# Patient Record
Sex: Female | Born: 1950 | Race: White | Hispanic: Yes | Smoking: Never smoker
Health system: Southern US, Community
[De-identification: ages and names within clinical notes are randomized; demographics above are authoritative.]

## PROBLEM LIST (undated history)

## (undated) DIAGNOSIS — I10 Essential (primary) hypertension: Secondary | ICD-10-CM

## (undated) DIAGNOSIS — F419 Anxiety disorder, unspecified: Secondary | ICD-10-CM

---

## 2009-04-26 ENCOUNTER — Ambulatory Visit: Payer: Self-pay | Admitting: Family Medicine

## 2009-04-26 ENCOUNTER — Ambulatory Visit (HOSPITAL_COMMUNITY): Admission: RE | Admit: 2009-04-26 | Discharge: 2009-04-26 | Payer: Self-pay | Admitting: Family Medicine

## 2009-04-26 DIAGNOSIS — F329 Major depressive disorder, single episode, unspecified: Secondary | ICD-10-CM | POA: Insufficient documentation

## 2009-04-26 DIAGNOSIS — R05 Cough: Secondary | ICD-10-CM

## 2009-04-26 DIAGNOSIS — R059 Cough, unspecified: Secondary | ICD-10-CM | POA: Insufficient documentation

## 2009-04-26 DIAGNOSIS — K2970 Gastritis, unspecified, without bleeding: Secondary | ICD-10-CM | POA: Insufficient documentation

## 2009-04-26 DIAGNOSIS — I10 Essential (primary) hypertension: Secondary | ICD-10-CM | POA: Insufficient documentation

## 2009-04-26 DIAGNOSIS — K2990 Gastroduodenitis, unspecified, without bleeding: Secondary | ICD-10-CM

## 2009-04-26 DIAGNOSIS — R011 Cardiac murmur, unspecified: Secondary | ICD-10-CM

## 2009-04-27 ENCOUNTER — Ambulatory Visit: Payer: Self-pay | Admitting: Family Medicine

## 2009-04-27 ENCOUNTER — Encounter: Payer: Self-pay | Admitting: Family Medicine

## 2009-04-27 LAB — CONVERTED CEMR LAB
Albumin: 3.9 g/dL (ref 3.5–5.2)
Alkaline Phosphatase: 84 units/L (ref 39–117)
BUN: 12 mg/dL (ref 6–23)
Calcium: 9.1 mg/dL (ref 8.4–10.5)
Cholesterol: 219 mg/dL — ABNORMAL HIGH (ref 0–200)
Glucose, Bld: 88 mg/dL (ref 70–99)
HDL: 57 mg/dL (ref >39)
LDL Cholesterol: 146 mg/dL — ABNORMAL HIGH (ref 0–99)
Potassium: 4 meq/L (ref 3.5–5.3)
Triglycerides: 78 mg/dL (ref <150)

## 2009-04-30 ENCOUNTER — Encounter: Payer: Self-pay | Admitting: Family Medicine

## 2009-05-04 ENCOUNTER — Telehealth: Payer: Self-pay | Admitting: Family Medicine

## 2009-05-04 ENCOUNTER — Encounter: Payer: Self-pay | Admitting: Family Medicine

## 2009-05-31 ENCOUNTER — Ambulatory Visit: Payer: Self-pay | Admitting: Family Medicine

## 2009-05-31 DIAGNOSIS — L259 Unspecified contact dermatitis, unspecified cause: Secondary | ICD-10-CM

## 2009-05-31 DIAGNOSIS — E349 Endocrine disorder, unspecified: Secondary | ICD-10-CM | POA: Insufficient documentation

## 2009-06-21 ENCOUNTER — Ambulatory Visit: Payer: Self-pay | Admitting: Family Medicine

## 2011-01-18 ENCOUNTER — Encounter: Payer: Self-pay | Admitting: *Deleted

## 2011-02-09 ENCOUNTER — Other Ambulatory Visit: Payer: Self-pay | Admitting: Family Medicine

## 2011-02-09 NOTE — Telephone Encounter (Signed)
Please review and refill

## 2011-02-10 NOTE — Telephone Encounter (Signed)
Refill request

## 2011-02-13 ENCOUNTER — Ambulatory Visit (INDEPENDENT_AMBULATORY_CARE_PROVIDER_SITE_OTHER): Payer: Self-pay

## 2011-02-13 ENCOUNTER — Inpatient Hospital Stay (INDEPENDENT_AMBULATORY_CARE_PROVIDER_SITE_OTHER)
Admission: RE | Admit: 2011-02-13 | Discharge: 2011-02-13 | Disposition: A | Discharge status: Home / Self Care | Payer: Self-pay | Source: Ambulatory Visit | Attending: Family Medicine | Admitting: Family Medicine

## 2011-02-13 DIAGNOSIS — R319 Hematuria, unspecified: Secondary | ICD-10-CM

## 2011-02-13 DIAGNOSIS — M549 Dorsalgia, unspecified: Secondary | ICD-10-CM

## 2011-02-13 DIAGNOSIS — N390 Urinary tract infection, site not specified: Secondary | ICD-10-CM

## 2011-02-13 LAB — URINALYSIS, ROUTINE W REFLEX MICROSCOPIC
Bilirubin Urine: NEGATIVE
Nitrite: NEGATIVE
Specific Gravity, Urine: 1.019 (ref 1.005–1.030)
Urobilinogen, UA: 0.2 mg/dL (ref 0.0–1.0)

## 2011-02-13 LAB — POCT URINALYSIS DIPSTICK
Bilirubin Urine: NEGATIVE
Glucose, UA: NEGATIVE mg/dL
Nitrite: NEGATIVE
pH: 5.5 (ref 5.0–8.0)

## 2011-02-13 LAB — URINE MICROSCOPIC-ADD ON

## 2011-02-15 LAB — URINE CULTURE

## 2011-02-16 ENCOUNTER — Other Ambulatory Visit (HOSPITAL_COMMUNITY): Payer: Self-pay | Admitting: Adult Health

## 2011-02-16 DIAGNOSIS — N200 Calculus of kidney: Secondary | ICD-10-CM

## 2011-02-17 ENCOUNTER — Encounter (HOSPITAL_COMMUNITY): Payer: Self-pay

## 2011-02-17 ENCOUNTER — Ambulatory Visit (HOSPITAL_COMMUNITY)
Admission: RE | Admit: 2011-02-17 | Discharge: 2011-02-17 | Disposition: A | Discharge status: Home / Self Care | Payer: Self-pay | Source: Ambulatory Visit | Attending: Adult Health | Admitting: Adult Health

## 2011-02-17 DIAGNOSIS — N2889 Other specified disorders of kidney and ureter: Secondary | ICD-10-CM | POA: Insufficient documentation

## 2011-02-17 DIAGNOSIS — N201 Calculus of ureter: Secondary | ICD-10-CM | POA: Insufficient documentation

## 2011-02-17 DIAGNOSIS — K439 Ventral hernia without obstruction or gangrene: Secondary | ICD-10-CM | POA: Insufficient documentation

## 2011-02-17 DIAGNOSIS — N200 Calculus of kidney: Secondary | ICD-10-CM | POA: Insufficient documentation

## 2011-02-17 HISTORY — DX: Essential (primary) hypertension: I10

## 2011-02-17 MED ORDER — IOHEXOL 300 MG/ML  SOLN
100.0000 mL | Freq: Once | INTRAMUSCULAR | Status: AC | PRN
  Administered 2011-02-17: 100 mL via INTRAVENOUS

## 2011-02-27 ENCOUNTER — Ambulatory Visit (HOSPITAL_COMMUNITY)
Admission: RE | Admit: 2011-02-27 | Discharge: 2011-02-27 | Disposition: A | Discharge status: Home / Self Care | Payer: Self-pay | Source: Ambulatory Visit | Attending: Urology | Admitting: Urology

## 2011-02-27 DIAGNOSIS — I10 Essential (primary) hypertension: Secondary | ICD-10-CM | POA: Insufficient documentation

## 2011-02-27 DIAGNOSIS — K219 Gastro-esophageal reflux disease without esophagitis: Secondary | ICD-10-CM | POA: Insufficient documentation

## 2011-02-27 DIAGNOSIS — N200 Calculus of kidney: Secondary | ICD-10-CM | POA: Insufficient documentation

## 2011-06-02 ENCOUNTER — Ambulatory Visit: Payer: Self-pay | Admitting: Family Medicine

## 2012-09-08 ENCOUNTER — Other Ambulatory Visit: Payer: Self-pay | Admitting: Family Medicine

## 2012-09-10 ENCOUNTER — Other Ambulatory Visit: Payer: Self-pay | Admitting: Family Medicine

## 2013-05-12 ENCOUNTER — Other Ambulatory Visit: Payer: Self-pay | Admitting: *Deleted

## 2013-05-12 NOTE — Telephone Encounter (Signed)
Requested Prescriptions   Pending Prescriptions Disp Refills  . hydrochlorothiazide (HYDRODIURIL) 25 MG tablet 30 tablet 6   Wyatt Haste, RN-BSN

## 2013-05-19 ENCOUNTER — Emergency Department (HOSPITAL_COMMUNITY)
Admission: EM | Admit: 2013-05-19 | Discharge: 2013-05-19 | Disposition: A | Discharge status: Home / Self Care | Payer: No Typology Code available for payment source | Source: Home / Self Care | Attending: Family Medicine | Admitting: Family Medicine

## 2013-05-19 ENCOUNTER — Other Ambulatory Visit: Payer: Self-pay | Admitting: *Deleted

## 2013-05-19 ENCOUNTER — Encounter (HOSPITAL_COMMUNITY): Payer: Self-pay

## 2013-05-19 ENCOUNTER — Emergency Department (INDEPENDENT_AMBULATORY_CARE_PROVIDER_SITE_OTHER): Payer: No Typology Code available for payment source

## 2013-05-19 DIAGNOSIS — R222 Localized swelling, mass and lump, trunk: Secondary | ICD-10-CM

## 2013-05-19 DIAGNOSIS — R918 Other nonspecific abnormal finding of lung field: Secondary | ICD-10-CM

## 2013-05-19 DIAGNOSIS — I10 Essential (primary) hypertension: Secondary | ICD-10-CM

## 2013-05-19 MED ORDER — HYDROCHLOROTHIAZIDE 25 MG PO TABS: 25.0000 mg | ORAL_TABLET | Freq: Every day | ORAL | Status: DC

## 2013-05-19 NOTE — ED Notes (Signed)
History w assistance of daughter. Reports cough x couple of days. Reportedly fell on slippery floor a couple of days ago, has pain in both shoulders , worse w ROM upper arm, in scapular area, and pain in her left knee

## 2013-05-19 NOTE — ED Provider Notes (Signed)
History     CSN: [id number]  Arrival date & time 05/19/13  1618   First MD Initiated Contact with Patient 05/19/13 1745      Chief Complaint  Patient presents with  . Fall    (Consider location/radiation/quality/duration/timing/severity/associated sxs/prior treatment) Patient is a 62 y.o. female presenting with fall. The history is provided by the patient and a relative. The history is limited by a language barrier. Language interpreter used: daughter.  Fall This is a new problem. The current episode started more than 2 days ago (slipped on wet floor, no lumbar pain, no ext or head sx.). The problem has not changed since onset.Pertinent negatives include no chest pain, no abdominal pain and no shortness of breath.    Past Medical History  Diagnosis Date  . Hypertension     History reviewed. No pertinent past surgical history.  History reviewed. No pertinent family history.  History  Substance Use Topics  . Smoking status: Not on file  . Smokeless tobacco: Not on file  . Alcohol Use: Not on file    OB History   Grav Para Term Preterm Abortions TAB SAB Ect Mult Living                  Review of Systems  Constitutional: Negative.   HENT: Negative for neck pain.   Respiratory: Negative for chest tightness and shortness of breath.   Cardiovascular: Negative for chest pain and leg swelling.  Gastrointestinal: Negative.  Negative for abdominal pain.  Genitourinary: Negative.     Allergies  Ace inhibitors  Home Medications   Current Outpatient Rx  Name  Route  Sig  Dispense  Refill  . captopril (CAPOTEN) 50 MG tablet   Oral   Take 50 mg by mouth 3 (three) times daily.         . citalopram (CELEXA) 20 MG tablet   Oral   Take 20 mg by mouth daily. For 1 week then 2 by mouth once daily for 1 week then 3 once daily. Instructions in spanish please          . famotidine (PEPCID) 20 MG tablet   Oral   Take 20 mg by mouth 2 (two) times daily. Instructions in  Spanish          . hydrochlorothiazide 25 MG tablet      TAKE ONE TABLET BY MOUTH EVERY DAY -  TOME  UNA  TABLETA  VIA  ORAL  UNA  VEZ  AL  DIA   30 tablet   6   . hydrOXYzine (ATARAX) 50 MG tablet   Oral   Take 50 mg by mouth every 6 (six) hours as needed. Itching/anxiety. Instructions in spanish please          . triamcinolone (KENALOG) 0.1 % lotion   Topical   Apply topically 2 (two) times daily. To scalp. Spanish instructions            BP 157/67  Pulse 75  Temp(Src) 98.4 F (36.9 C) (Oral)  Resp 16  SpO2 100%  Physical Exam  Nursing note and vitals reviewed. Constitutional: She is oriented to person, place, and time. She appears well-developed and well-nourished.  HENT:  Head: Normocephalic and atraumatic.  Neck: Normal range of motion. Neck supple.  Cardiovascular: Normal rate, regular rhythm, normal heart sounds and intact distal pulses.   Pulmonary/Chest: Breath sounds normal. She exhibits tenderness.    Abdominal: Soft. Bowel sounds are normal.  Neurological: She is alert  and oriented to person, place, and time.  Skin: Skin is warm and dry.    ED Course  Procedures (including critical care time)  Labs Reviewed - No data to display Dg Ribs Unilateral W/chest Left  05/19/2013   *RADIOLOGY REPORT*  Clinical Data: Fall.  Complains of left lower rib fall 3 days ago.  LEFT RIBS AND CHEST - 3+ VIEW  Comparison: None.  Findings: Heart size is upper normal.  Mediastinal hilar contours within normal limits.  There is an irregular asymmetric opacity at the left lung apex with some architectural distortion. There are no prior chest radiographs for comparison.  Negative for pneumothorax or pleural effusion.  No acute or healing left rib fracture is identified.  IMPRESSION:  1.  No visible left rib fracture. 2.  Asymmetric opacity at the left lung apex is favored to be due to pleural parenchymal scarring.  There are no prior chest radiographs for comparison.  If prior  chest radiographs can be made available for comparison purposes that could be useful.  Otherwise, a noncontrast chest CT could be considered to exclude a mass.  These results will be called to the ordering clinician or representative by the Radiologist Assistant, and communication documented in the PACS Dashboard.   Original Report Authenticated By: Britta Mccreedy, M.D.     1. Lung mass   2. Hypertension       MDM  X-rays reviewed and report per radiologist.         Linna Hoff, MD 05/19/13 1911

## 2013-05-27 ENCOUNTER — Ambulatory Visit (INDEPENDENT_AMBULATORY_CARE_PROVIDER_SITE_OTHER): Payer: No Typology Code available for payment source | Admitting: Family Medicine

## 2013-05-27 ENCOUNTER — Encounter: Payer: Self-pay | Admitting: Family Medicine

## 2013-05-27 VITALS — BP 150/86 | HR 74 | Temp 98.4°F | Ht 60.0 in | Wt 196.0 lb

## 2013-05-27 DIAGNOSIS — I10 Essential (primary) hypertension: Secondary | ICD-10-CM

## 2013-05-27 DIAGNOSIS — F329 Major depressive disorder, single episode, unspecified: Secondary | ICD-10-CM

## 2013-05-27 DIAGNOSIS — K2970 Gastritis, unspecified, without bleeding: Secondary | ICD-10-CM

## 2013-05-27 DIAGNOSIS — E669 Obesity, unspecified: Secondary | ICD-10-CM

## 2013-05-27 DIAGNOSIS — R05 Cough: Secondary | ICD-10-CM

## 2013-05-27 DIAGNOSIS — K2990 Gastroduodenitis, unspecified, without bleeding: Secondary | ICD-10-CM

## 2013-05-27 LAB — CBC
MCH: 31.2 pg (ref 26.0–34.0)
MCHC: 35.8 g/dL (ref 30.0–36.0)
Platelets: 263 10*3/uL (ref 150–400)
RBC: 4.45 MIL/uL (ref 3.87–5.11)

## 2013-05-27 LAB — COMPREHENSIVE METABOLIC PANEL
ALT: 13 U/L (ref 0–35)
Alkaline Phosphatase: 96 U/L (ref 39–117)
CO2: 30 mEq/L (ref 19–32)
Sodium: 140 mEq/L (ref 135–145)
Total Bilirubin: 0.6 mg/dL (ref 0.3–1.2)
Total Protein: 7.6 g/dL (ref 6.0–8.3)

## 2013-05-27 LAB — LIPID PANEL
Total CHOL/HDL Ratio: 4.2 Ratio
VLDL: 16 mg/dL (ref 0–40)

## 2013-05-27 MED ORDER — AMLODIPINE BESYLATE 5 MG PO TABS: 5.0000 mg | ORAL_TABLET | Freq: Every day | ORAL | Status: DC

## 2013-05-27 MED ORDER — HYDROCHLOROTHIAZIDE 25 MG PO TABS: 25.0000 mg | ORAL_TABLET | Freq: Every day | ORAL | Status: DC

## 2013-05-27 MED ORDER — RANITIDINE HCL 150 MG PO TABS: 150.0000 mg | ORAL_TABLET | Freq: Two times a day (BID) | ORAL | Status: DC

## 2013-05-27 NOTE — Patient Instructions (Addendum)
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   Para la presion alta:  1. Descontinue la captopril.  2. Comience a tomar Amlodipine 5mg  una tableta por dia, en las mananas.  3. Le llamo con los [employer] estudios de hoy Kimber[place], Aeronautical engineer).  Para el dolor en el costado: 1. Aplicar bolsa de agua caliente varias veces por dia.  2. Puede tomar ibuprofen tabletas de 200mg , tome 2 a 4 tabletas cada 6 a 8 horas segun necesite, por hasta 7 dias consecutivos.   Para la depresion: Estamos chequeando la funcion de la tiroide.  Quiero verle en un mes para saber como se siente.   Para el dolor en la boca del [place]: Tome la ranitidine 150mg , una tableta dos veces por dia, hasta la proxima visita.   FOLLOW UP VISIT IN 4 TO 6 WEEKS WITH DR Mauricio Po

## 2013-05-27 NOTE — Assessment & Plan Note (Addendum)
i think from ACEI.  Will stop captopril and change to other med (amlodipine).

## 2013-05-28 ENCOUNTER — Telehealth: Payer: Self-pay | Admitting: Family Medicine

## 2013-05-28 NOTE — Telephone Encounter (Signed)
Called mobile number on chart [phone]) and left messagein Spanish on daughter-in-law's phone [name]) that lab results do not require additional intervention, asked that she call with questions.  JB

## 2013-05-28 NOTE — Assessment & Plan Note (Signed)
To change from ACEI (captopril) to CCB (amlodipine) due to cough that I suspect is related to the ACEI use.  Will reassess BP and cough at follow up.  Check chem panel and lipid profile today.

## 2013-05-28 NOTE — Assessment & Plan Note (Signed)
Admits to depressive symptoms (anhedonia, feeling depressed) that is worse when in [place], where her son there is more financially distressed and this causes her anguish.  She does not have the citalopram that is listed on her med list; will reassess after checking TSH and discuss at follow up.

## 2013-05-28 NOTE — Assessment & Plan Note (Addendum)
Trial H2blocker and dietary changes.  To reassess at follow up.  This may also be contributing to her complaint of longstanding cough.

## 2013-05-28 NOTE — Progress Notes (Signed)
  Subjective:    Patient ID: Brenda Wyatt, female    DOB: [birth year], 62 y.o.   MRN: [id number]  HPI Visit conducted in Spanish. New patient visit (last seen in this office in 2010).   [name] is accompanied by her daughter-in-law, [name].    Here for follow up of HTN, also to discuss three other problems:   1. C/o frequent dry cough and "pulmonary infections".  She lives half-time in her native [place] and the other half in [place]; notices it is worse in [place] than here.  Has been treated for pneumonia several times while there, has had workup for the cough including CXR.  Feels subjectively warm but has not measured a fever.  The current flare of this complaint (cough without sputum) has been ongoing for 15 days.  No blood in the scant sputum she can bring up.  Never-smoker. No known history of asthma or other lung disease (other than the pneumonia). No known contact with persons with TB, either here or in [place].    2. Some discomfort, like a fullness, in the epigastric area.  Has vague sensation of nausea but does not vomit.  Does not eat much (corroborated by [name]).  No objective weight loss. No diarrhea, no blood in stool.   3. Patient had a slip-and-fall at home eight days ago, landed on left flank.  Still with some soreness in the area, especially with the cough.  No dyspnea, no blood-tinged sputum.  No radiating pain to abdomen or anterior chest.  Has not noticed much in the way of bruising.  The area feels "like a cramp" when she coughs or breathes deeply.   Social Hx; Never-smoker; does not drink alcohol.  Two sons live in [place] and two sons here.  She does have some feelings of depression but more so when in [place] (one son there had fallen from a roof while working and now is unable to work, has financial difficulties).  She traveled here from [place] by bus.    Family Hx; Mother died o Review of Systems See above. Feels "tickle" in back of throat that  triggers cough. No fevers or chills. No thoughts of self-harm.     Objective:   Physical Exam Well appearing, no apparent distress HEENT Neck supple. Thyroid supple, no nodularity or tenderness, no goiter. Moist mucus membranes. No cervical adenopathy.  COR Regular S1S2, no extra sounds.  PULM Clear bilaterally, no rales or wheezes.  CHEST WALL: question of mild ecchymosis over left lateral chest wall (@ ribs 9-10).  No step-off or notable point tenderness.  ABD Soft, nontender, nondistended. No organomegaly or masses.  Perhaps mild discomfort at epigastrium with palpation.  LEs no edema noted in lower extremities.        Assessment & Plan:

## 2013-09-05 ENCOUNTER — Other Ambulatory Visit: Payer: Self-pay | Admitting: Family Medicine

## 2013-09-05 ENCOUNTER — Ambulatory Visit (INDEPENDENT_AMBULATORY_CARE_PROVIDER_SITE_OTHER): Payer: No Typology Code available for payment source | Admitting: Family Medicine

## 2013-09-05 ENCOUNTER — Encounter: Payer: Self-pay | Admitting: Family Medicine

## 2013-09-05 VITALS — BP 150/80 | HR 66 | Ht 60.0 in | Wt 199.5 lb

## 2013-09-05 DIAGNOSIS — F329 Major depressive disorder, single episode, unspecified: Secondary | ICD-10-CM

## 2013-09-05 DIAGNOSIS — I10 Essential (primary) hypertension: Secondary | ICD-10-CM

## 2013-09-05 DIAGNOSIS — K2970 Gastritis, unspecified, without bleeding: Secondary | ICD-10-CM

## 2013-09-05 LAB — COMPREHENSIVE METABOLIC PANEL
AST: 21 U/L (ref 0–37)
Albumin: 3.9 g/dL (ref 3.5–5.2)
Alkaline Phosphatase: 88 U/L (ref 39–117)
Potassium: 3.6 mEq/L (ref 3.5–5.3)
Sodium: 138 mEq/L (ref 135–145)
Total Protein: 8 g/dL (ref 6.0–8.3)

## 2013-09-05 MED ORDER — OMEPRAZOLE 20 MG PO CPDR: 40.0000 mg | DELAYED_RELEASE_CAPSULE | Freq: Every day | ORAL | Status: DC

## 2013-09-05 MED ORDER — HYDROCHLOROTHIAZIDE 25 MG PO TABS: 25.0000 mg | ORAL_TABLET | Freq: Every day | ORAL | Status: DC

## 2013-09-05 MED ORDER — AMLODIPINE BESYLATE 5 MG PO TABS: 5.0000 mg | ORAL_TABLET | Freq: Every day | ORAL | Status: DC

## 2013-09-05 NOTE — Patient Instructions (Addendum)
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  Recibio' la vacuna de gripe hoy.  Para el estrenimiento y la sensacion en la boca del [place], quiero chequearle una tomografia del abdomen.  Le llamo al cel. [phone] [name], nuera) con [place].   Cambie' la medicina del [place] al OMEPRAZOLE 20MG , TOME 2 POR BOCA UNA VEZ POR DIA.   Las demas medicinas para la presion las [place]' a la farmacia en cantidades de 90 dias.   Espero que tenga feliz viaje el 30 de octubre!

## 2013-09-06 NOTE — Progress Notes (Signed)
  Subjective:    Patient ID: Brenda Wyatt, female    DOB: 19-Feb-1951, 62 y.o.   MRN: [id number]  HPI Visit conducted in Spanish. Patient here with her daughter-in-law, Brenda Wyatt. Here for follow up of HTN, as well as to address epigastric discomfort, which she describes as "tension" just below the xyphoid.  This sensation, which is not stabbing or sharp, comes and goes and is associated with nervousness.  Does not have everyday.  She did not notice an improvement with the ranitidine, which she has stopped taking.  Patient also has a history of depression and had been taking citalopram, but stopped taking it awhile ago.  She did not find it to be helpful.    Lastly, patient reports changes in her bowel habits and patterns.  Says she has been more constipated than usual.  She usually has a bowel movement every 3 days, has been longer intervals recently.  Stool has appeared to be narrower in caliber, without blood or mucus.  She denies fever, chills, sweats, chest pain, or cough. She has not had unintentional weight loss.  She and Brenda Wyatt recall a bout of lung disease many years ago during which time she says she was told she might not survive.   Family Hx; mother died in her 41s of liver cancer.   Social Hx; Patient has never been a smoker, does not drink alcohol.  She plans to travel to [place] on Oct 30th to pass the winter there. Review of Systems     Objective:   Physical Exam Generally well appearing, nervous affect, no acute distress HEENT Neck supple, no cervical adenopathy. MMM COR Regular S1S2 PULM Clear bilaterally.  ABD Soft, nondistended and with audible bowel sounds. Mild epigastric tenderness, no organomegaly.  No masses appreciated.  PHQ9 done today in Spanish scores a 7 (3 pts Q#3; 2 pts Q#4; 1 pt Q#1,2).       Assessment & Plan:

## 2013-09-06 NOTE — Assessment & Plan Note (Addendum)
Unclear if her abdominal complaints are more related to anxiety or true gastritis.  No associated with mealtimes; described in emotional terms rather than somatic terms ("nervous", "tension").  Will change to generic PPI and see if this is helpful.  Also, concern for narrowing in the caliber of her stools and worsening constipation.  Patient herself voices concern for liver cancer given her mother's death from this.  WIll order labs, CT abd/pelvis.  Discussed recommendation for GI referral for possible endoscopic evaluation, patient does not have payer source for this.  Will get preliminary studies first and then proceed from there.  Could also consider barium enema initially, as patient has [employer].

## 2013-09-06 NOTE — Assessment & Plan Note (Signed)
Patient with diagnosis of depression, PHQ9 today is 7, without SI or HI.  She is not on antidepressants.  Is animated when talking about upcoming travel to [place], which I believe is therapeutic for her.  No further interventions at this time.

## 2013-09-08 ENCOUNTER — Telehealth: Payer: Self-pay | Admitting: Family Medicine

## 2013-09-08 ENCOUNTER — Ambulatory Visit (HOSPITAL_COMMUNITY): Admission: RE | Admit: 2013-09-08 | Payer: No Typology Code available for payment source | Source: Ambulatory Visit

## 2013-09-08 DIAGNOSIS — I10 Essential (primary) hypertension: Secondary | ICD-10-CM

## 2013-09-08 DIAGNOSIS — K2970 Gastritis, unspecified, without bleeding: Secondary | ICD-10-CM

## 2013-09-08 MED ORDER — HYDROCHLOROTHIAZIDE 25 MG PO TABS: 25.0000 mg | ORAL_TABLET | Freq: Every day | ORAL | Status: DC

## 2013-09-08 MED ORDER — OMEPRAZOLE 20 MG PO CPDR: 40.0000 mg | DELAYED_RELEASE_CAPSULE | Freq: Every day | ORAL | Status: DC

## 2013-09-08 MED ORDER — AMLODIPINE BESYLATE 5 MG PO TABS: 5.0000 mg | ORAL_TABLET | Freq: Every day | ORAL | Status: DC

## 2013-09-08 NOTE — Telephone Encounter (Signed)
Called daughter-in-law [name] and discussed lab results.  She says that the med prescriptions were not available at [facility].  I am going to resend these prescriptions for 90 days each.  She has appointment for CT abd/pelvis on Monday, October 13th.  JB

## 2014-07-17 ENCOUNTER — Encounter: Payer: Self-pay | Admitting: Family Medicine

## 2014-07-17 ENCOUNTER — Ambulatory Visit (INDEPENDENT_AMBULATORY_CARE_PROVIDER_SITE_OTHER): Payer: Self-pay | Admitting: Family Medicine

## 2014-07-17 ENCOUNTER — Ambulatory Visit (HOSPITAL_COMMUNITY)
Admission: RE | Admit: 2014-07-17 | Discharge: 2014-07-17 | Disposition: A | Discharge status: Home / Self Care | Payer: Self-pay | Source: Ambulatory Visit | Attending: Family Medicine | Admitting: Family Medicine

## 2014-07-17 VITALS — BP 156/74 | HR 78 | Temp 98.2°F | Ht 60.0 in | Wt 203.7 lb

## 2014-07-17 DIAGNOSIS — I10 Essential (primary) hypertension: Secondary | ICD-10-CM | POA: Insufficient documentation

## 2014-07-17 DIAGNOSIS — R3589 Other polyuria: Secondary | ICD-10-CM | POA: Insufficient documentation

## 2014-07-17 DIAGNOSIS — R609 Edema, unspecified: Secondary | ICD-10-CM

## 2014-07-17 DIAGNOSIS — L83 Acanthosis nigricans: Secondary | ICD-10-CM

## 2014-07-17 DIAGNOSIS — R600 Localized edema: Secondary | ICD-10-CM

## 2014-07-17 DIAGNOSIS — R358 Other polyuria: Secondary | ICD-10-CM | POA: Insufficient documentation

## 2014-07-17 LAB — POCT URINALYSIS DIPSTICK
BILIRUBIN UA: NEGATIVE
GLUCOSE UA: NEGATIVE
Ketones, UA: NEGATIVE
NITRITE UA: NEGATIVE
Protein, UA: NEGATIVE
Spec Grav, UA: 1.02
UROBILINOGEN UA: 0.2
pH, UA: 7

## 2014-07-17 LAB — COMPREHENSIVE METABOLIC PANEL
ALK PHOS: 80 U/L (ref 39–117)
ALT: 15 U/L (ref 0–35)
AST: 18 U/L (ref 0–37)
Albumin: 4.1 g/dL (ref 3.5–5.2)
BILIRUBIN TOTAL: 0.6 mg/dL (ref 0.2–1.2)
BUN: 13 mg/dL (ref 6–23)
CO2: 27 mEq/L (ref 19–32)
Calcium: 8.9 mg/dL (ref 8.4–10.5)
Chloride: 102 mEq/L (ref 96–112)
Creat: 0.57 mg/dL (ref 0.50–1.10)
GLUCOSE: 102 mg/dL — AB (ref 70–99)
POTASSIUM: 4 meq/L (ref 3.5–5.3)
SODIUM: 140 meq/L (ref 135–145)
TOTAL PROTEIN: 7.6 g/dL (ref 6.0–8.3)

## 2014-07-17 LAB — POCT UA - MICROSCOPIC ONLY

## 2014-07-17 LAB — CBC
HCT: 40 % (ref 36.0–46.0)
HEMOGLOBIN: 13.8 g/dL (ref 12.0–15.0)
MCH: 30.1 pg (ref 26.0–34.0)
MCHC: 34.5 g/dL (ref 30.0–36.0)
MCV: 87.1 fL (ref 78.0–100.0)
Platelets: 260 10*3/uL (ref 150–400)
RBC: 4.59 MIL/uL (ref 3.87–5.11)
RDW: 13.8 % (ref 11.5–15.5)
WBC: 6.2 10*3/uL (ref 4.0–10.5)

## 2014-07-17 LAB — POCT GLYCOSYLATED HEMOGLOBIN (HGB A1C): Hemoglobin A1C: 5.8

## 2014-07-17 MED ORDER — HYDROCHLOROTHIAZIDE 25 MG PO TABS: 25.0000 mg | ORAL_TABLET | Freq: Every day | ORAL | Status: DC

## 2014-07-17 MED ORDER — METOPROLOL TARTRATE 25 MG PO TABS: 25.0000 mg | ORAL_TABLET | Freq: Two times a day (BID) | ORAL | Status: DC

## 2014-07-17 NOTE — Assessment & Plan Note (Signed)
Polyuria and acanthosis, raises concerns for DM.  Also to evaluate for possible UTI as cause of polyuria. UA in office today.

## 2014-07-17 NOTE — Progress Notes (Signed)
   Subjective:    Patient ID: Brenda Wyatt, female    DOB: March 14, 1951, 63 y.o.   MRN: [id number]  HPI Visit conducted in [place].  Patient presents for a few complaints:   1. C/o bilateral ankle swelling.  Has noticed over the past several months. Wonders if it's related to having her hair dyed. Denies chest pain but does have intermittent episodes of palpitations and mild pressure which may last for minutes to hours. Unable to give precise time course for this complaint. Some shortness of breath associated with this complaint and with exertion (ie, climbing stairs).   2. Generalized hot flashes for several years. Menopause in her 29s.  No vaginal bleeding or discharge. She reports having had mammography (screening) in January of 2015 and PAP smear in May 2014, both in [place] and both reported as normal.  No fevers, no weight loss.  Has had decreased appetite but does gain weight.   3. Reports polyuria without dysuria.  No change in urine appearance. No diarrhea or loose stools.    Review of Systems See HPI    Objective:   Physical Exam Well appearing, no apparent distress HEENT Neck supple, No JVD. Normal supple thyroid without nodularity or tenderness. No cervical adenopathy. COR Regular S1S2, no extra sounds PULM Clear bilaterally, no rales or wheezes.  SKIN: Acanthosis nigricans noted along neck line.  ABD Soft, nontender, nondistended. No masses noted.  EXTS: 1+ bilateral lower extremity edema to 1/3 way up calves. No disparity in calf girth. No calf tenderness. Marked superficial tortuous varicosities on both legs. NEURO Able to climb up onto exam table without difficulty. Gait unremarkable without assistance.        Assessment & Plan:

## 2014-07-17 NOTE — Assessment & Plan Note (Signed)
Bilateral lower extremity edema; CXR, ECG, labs to evaluate for renal function, protein. UA for polyuria.

## 2014-07-17 NOTE — Patient Instructions (Signed)
Fue un Civil Service fast streamer.   Para la presion alta, quiero que DEJE DE TOMAR LA AMLODIPINE.  Siga tomando la HYDROCHLOROTHIAZIDE 25mg  UNA TABLETA DIARIO. Estamos comenzando una nueva medicina, METOPROLOL 25mg  UNA TABLETA 2 VECES POR DIA.   Electrocardiograma hoy.  Le estoy mandando para una placa del pecho para la fatiga de la respiracion.   Laboratorios h[place].   Para el hinchazon de las piernas, quiero que se compre un par de medias apretadas que se venden en la farmacia ("Compression hose") sin receta, y que las use todos los [place].   Quiero volver a Public affairs consultant.   FOLLOW UP WITH DR Lindell Noe IN 1 MONTH.  PATIENT WITH ORANGE CARD, RECOMMENDED FOR SCREENING COLONOSCOPY, DISCUSSED WITH PATIENT.

## 2014-07-17 NOTE — Assessment & Plan Note (Signed)
HTN not well controlled, suspect the amlodipine may be contributing to LE edema. Degree of anxiety as well; plan to d/c CCB and start metoprolol bid. She had been on ACEI but had developed a cough with this, which disappeared when ACEI was discontinued. Continue HCTZ along with the newly started BB. May continue baby ASA daily for prevention.

## 2014-07-18 LAB — TSH: TSH: 0.405 u[IU]/mL (ref 0.350–4.500)

## 2014-07-20 ENCOUNTER — Encounter: Payer: Self-pay | Admitting: Family Medicine

## 2019-05-22 ENCOUNTER — Ambulatory Visit (HOSPITAL_COMMUNITY)
Admission: EM | Admit: 2019-05-22 | Discharge: 2019-05-22 | Disposition: A | Discharge status: Home / Self Care | Payer: Medicare Other | Attending: Family Medicine | Admitting: Family Medicine

## 2019-05-22 ENCOUNTER — Telehealth: Payer: Self-pay | Admitting: *Deleted

## 2019-05-22 ENCOUNTER — Encounter (HOSPITAL_COMMUNITY): Payer: Self-pay | Admitting: Emergency Medicine

## 2019-05-22 ENCOUNTER — Other Ambulatory Visit: Payer: Self-pay

## 2019-05-22 DIAGNOSIS — Z20822 Contact with and (suspected) exposure to covid-19: Secondary | ICD-10-CM

## 2019-05-22 DIAGNOSIS — R05 Cough: Secondary | ICD-10-CM

## 2019-05-22 DIAGNOSIS — M791 Myalgia, unspecified site: Secondary | ICD-10-CM | POA: Diagnosis not present

## 2019-05-22 DIAGNOSIS — R6889 Other general symptoms and signs: Secondary | ICD-10-CM

## 2019-05-22 DIAGNOSIS — Z20828 Contact with and (suspected) exposure to other viral communicable diseases: Secondary | ICD-10-CM

## 2019-05-22 DIAGNOSIS — R509 Fever, unspecified: Secondary | ICD-10-CM

## 2019-05-22 DIAGNOSIS — I10 Essential (primary) hypertension: Secondary | ICD-10-CM | POA: Diagnosis not present

## 2019-05-22 MED ORDER — BENZONATATE 200 MG PO CAPS: 200.0000 mg | ORAL_CAPSULE | Freq: Three times a day (TID) | ORAL | 0 refills | Status: AC | PRN

## 2019-05-22 NOTE — Addendum Note (Signed)
Addended by: Linus Orn A on: 05/22/2019 01:04 PM   Modules accepted: Orders

## 2019-05-22 NOTE — Telephone Encounter (Signed)
Attempted to contact pt's daughter x 2 and received voicemail. Left message for pt's daughter, Verdis Frederickson to return call to be scheduled for COVID testing.

## 2019-05-22 NOTE — Telephone Encounter (Signed)
-----   Message from Janith Lima, PA-C sent at 05/22/2019 10:42 AM EDT ----- Regarding: Needs covid testing Fever, cough x 5 days, known exposure Contact daughterJohnette Abraham [phone]

## 2019-05-22 NOTE — Discharge Instructions (Signed)
Tessalon si necesita para tos Tylenol y ibuprofen para fiebre, dolor del cuerpo Regrese si no mejorna, tiene falta de aire, dolor del [place], [employer]

## 2019-05-22 NOTE — Telephone Encounter (Signed)
Pt. Called back. Scheduled for today.

## 2019-05-22 NOTE — ED Triage Notes (Signed)
Pt c/o body aches, fever, x3 days, pt has family member who tested positive for covid

## 2019-05-23 NOTE — ED Provider Notes (Signed)
[place]    CSN: [id number] Arrival date & time: 05/22/19  1779      History   Chief Complaint Chief Complaint  Patient presents with  . Exposure to COVID  . Generalized Body Aches    HPI Brenda Wyatt is a 68 y.o. female history of hypertension presenting today for evaluation of fever body aches and cough.  Patient symptoms began 5 days ago.  She had fever earlier in the week, but she feels her symptoms have improved.  She is starting to feel better.  She denies any chest pain or shortness of breath.  Her husband has tested positive for COVID and her daughter is here with similar symptoms and fever.  She is not taking any medicines for her symptoms.  HPI  Past Medical History:  Diagnosis Date  . Hypertension     Patient Active Problem List   Diagnosis Date Noted  . Lower extremity edema 07/17/2014  . Polyuria 07/17/2014  . Acanthosis nigricans 07/17/2014  . ENDOGENOUS OBESITY 05/31/2009  . CONTACT DERMATITIS&OTHER ECZEMA DUE UNSPEC CAUSE 05/31/2009  . DEPRESSION 04/26/2009  . HYPERTENSION 04/26/2009  . GASTRITIS 04/26/2009  . CARDIAC MURMUR 04/26/2009  . COUGH 04/26/2009    History reviewed. No pertinent surgical history.  OB History   No obstetric history on file.      Home Medications    Prior to Admission medications   Medication Sig Start Date End Date Taking? Authorizing Provider  benzonatate (TESSALON) 200 MG capsule Take 1 capsule (200 mg total) by mouth 3 (three) times daily as needed for up to 7 days for cough. 05/22/19 05/29/19  Terin Cragle C, PA-C  hydrochlorothiazide (HYDRODIURIL) 25 MG tablet Take 1 tablet (25 mg total) by mouth daily. 07/17/14   Willeen Niece, MD  metoprolol tartrate (LOPRESSOR) 25 MG tablet Take 1 tablet (25 mg total) by mouth 2 (two) times daily. 07/17/14   Willeen Niece, MD  omeprazole (PRILOSEC) 20 MG capsule Take 2 capsules (40 mg total) by mouth daily. 09/08/13   Willeen Niece, MD    Family  History No family history on file.  Social History Social History   Tobacco Use  . Smoking status: Never Smoker  Substance Use Topics  . Alcohol use: Not on file  . Drug use: Not on file     Allergies   Ace inhibitors and Penicillins   Review of Systems Review of Systems  Constitutional: Positive for fatigue and fever. Negative for activity change, appetite change and chills.  HENT: Positive for congestion. Negative for ear pain, rhinorrhea, sinus pressure, sore throat and trouble swallowing.   Eyes: Negative for discharge and redness.  Respiratory: Positive for cough. Negative for chest tightness and shortness of breath.   Cardiovascular: Negative for chest pain.  Gastrointestinal: Negative for abdominal pain, diarrhea, nausea and vomiting.  Musculoskeletal: Negative for myalgias.  Skin: Negative for rash.  Neurological: Negative for dizziness, light-headedness and headaches.     Physical Exam Triage Vital Signs ED Triage Vitals [05/22/19 1011]  Enc Vitals Group     BP (!) 174/62     Pulse Rate 76     Resp 16     Temp 97.8 F (36.6 C)     Temp src      SpO2 100 %     Weight      Height      Head Circumference      Peak Flow      Pain Score 5  Pain Loc      Pain Edu?      Excl. in [place]?    No data found.  Updated Vital Signs BP (!) 174/62   Pulse 76   Temp 97.8 F (36.6 C)   Resp 16   SpO2 100%   Visual Acuity Right Eye Distance:   Left Eye Distance:   Bilateral Distance:    Right Eye Near:   Left Eye Near:    Bilateral Near:     Physical Exam Vitals signs and nursing note reviewed.  Constitutional:      General: She is not in acute distress.    Appearance: She is well-developed.     Comments: No acute distress  HENT:     Head: Normocephalic and atraumatic.     Mouth/Throat:     Comments: Oral mucosa pink and moist, no tonsillar enlargement or exudate. Posterior pharynx patent and nonerythematous, no uvula deviation or swelling. Normal  phonation. Eyes:     Conjunctiva/sclera: Conjunctivae normal.  Neck:     Musculoskeletal: Neck supple.  Cardiovascular:     Rate and Rhythm: Normal rate and regular rhythm.     Heart sounds: No murmur.  Pulmonary:     Effort: Pulmonary effort is normal. No respiratory distress.     Breath sounds: Normal breath sounds.     Comments: Breathing comfortably at rest, CTABL, no wheezing, rales or other adventitious sounds auscultated\  Abdominal:     Palpations: Abdomen is soft.     Tenderness: There is no abdominal tenderness.  Skin:    General: Skin is warm and dry.  Neurological:     Mental Status: She is alert.      UC Treatments / Results  Labs (all labs ordered are listed, but only abnormal results are displayed) Labs Reviewed - No data to display  EKG None  Radiology No results found.  Procedures Procedures (including critical care time)  Medications Ordered in UC Medications - No data to display  Initial Impression / Assessment and Plan / UC Course  I have reviewed the triage vital signs and the nursing notes.  Pertinent labs & imaging results that were available during my care of the patient were reviewed by me and considered in my medical decision making (see chart for details).     Fever earlier in the week, vital signs stable now, mild URI symptoms, known exposure to COVID.  Will set up For testing, information sent to PCP.  Symptoms most likely viral/COVID.  Recommending symptomatic and supportive care, monitor for continued improvement.  Push fluids and rest.  Tylenol as needed.  Tessalon as needed for cough.Discussed strict return precautions. Patient verbalized understanding and is agreeable with plan.  Final Clinical Impressions(s) / UC Diagnoses   Final diagnoses:  Suspected Covid-19 Virus Infection     Discharge Instructions     Tessalon si necesita para tos Tylenol y ibuprofen para fiebre, dolor del cuerpo Regrese si no mejorna, tiene falta  de aire, dolor del [place], [facility]   ED Prescriptions    Medication Sig Dispense Auth. Provider   benzonatate (TESSALON) 200 MG capsule Take 1 capsule (200 mg total) by mouth 3 (three) times daily as needed for up to 7 days for cough. 28 capsule Bannon Giammarco C, PA-C     Controlled Substance Prescriptions [place] Controlled Substance Registry consulted? Not Applicable   Janith Lima, [place] 05/23/19 [id number]

## 2019-05-24 LAB — NOVEL CORONAVIRUS, NAA: SARS-CoV-2, NAA: DETECTED — AB

## 2019-05-25 ENCOUNTER — Telehealth: Payer: Self-pay

## 2019-05-25 NOTE — Telephone Encounter (Signed)
Called [facility] and left VM and informed them of pt's name, DOB, address and phone number and +Covid 19 result.

## 2019-05-26 ENCOUNTER — Telehealth: Payer: Self-pay

## 2019-05-26 NOTE — Telephone Encounter (Signed)
Faxed COVID test results to [facility]; Attn.: Tammy @ [phone].

## 2020-04-01 ENCOUNTER — Emergency Department (HOSPITAL_COMMUNITY): Payer: Medicare Other

## 2020-04-01 ENCOUNTER — Encounter (HOSPITAL_COMMUNITY): Payer: Self-pay | Admitting: Emergency Medicine

## 2020-04-01 ENCOUNTER — Emergency Department (HOSPITAL_COMMUNITY)
Admission: EM | Admit: 2020-04-01 | Discharge: 2020-04-02 | Disposition: A | Discharge status: Home / Self Care | Payer: Medicare Other | Attending: Emergency Medicine | Admitting: Emergency Medicine

## 2020-04-01 ENCOUNTER — Other Ambulatory Visit: Payer: Self-pay

## 2020-04-01 DIAGNOSIS — R002 Palpitations: Secondary | ICD-10-CM | POA: Diagnosis not present

## 2020-04-01 DIAGNOSIS — R0602 Shortness of breath: Secondary | ICD-10-CM | POA: Insufficient documentation

## 2020-04-01 DIAGNOSIS — Z5321 Procedure and treatment not carried out due to patient leaving prior to being seen by health care provider: Secondary | ICD-10-CM | POA: Diagnosis not present

## 2020-04-01 LAB — BASIC METABOLIC PANEL
Anion gap: 9 (ref 5–15)
BUN: 17 mg/dL (ref 8–23)
CO2: 28 mmol/L (ref 22–32)
Calcium: 8.9 mg/dL (ref 8.9–10.3)
Chloride: 104 mmol/L (ref 98–111)
Creatinine, Ser: 0.81 mg/dL (ref 0.44–1.00)
GFR calc Af Amer: >60 mL/min (ref >60)
GFR calc non Af Amer: >60 mL/min (ref >60)
Glucose, Bld: 127 mg/dL — ABNORMAL HIGH (ref 70–99)
Potassium: 3.5 mmol/L (ref 3.5–5.1)
Sodium: 141 mmol/L (ref 135–145)

## 2020-04-01 LAB — CBC
HCT: 40.6 % (ref 36.0–46.0)
Hemoglobin: 12.9 g/dL (ref 12.0–15.0)
MCH: 29.9 pg (ref 26.0–34.0)
MCHC: 31.8 g/dL (ref 30.0–36.0)
MCV: 94 fL (ref 80.0–100.0)
Platelets: 241 10*3/uL (ref 150–400)
RBC: 4.32 MIL/uL (ref 3.87–5.11)
RDW: 13 % (ref 11.5–15.5)
WBC: 6.4 10*3/uL (ref 4.0–10.5)
nRBC: 0 % (ref 0.0–0.2)

## 2020-04-01 LAB — TROPONIN I (HIGH SENSITIVITY)
Troponin I (High Sensitivity): 4 ng/L (ref <18)
Troponin I (High Sensitivity): 5 ng/L (ref <18)

## 2020-04-01 MED ORDER — SODIUM CHLORIDE 0.9% FLUSH: 3.0000 mL | Freq: Once | INTRAVENOUS | Status: DC

## 2020-04-01 NOTE — ED Triage Notes (Signed)
Patient reports palpitations with mild SOB onset this week , denies cough or fever , no emesis or diaphoresis .

## 2020-04-02 NOTE — ED Notes (Signed)
Pt left LWBS. Pt tired of waiting and has to go home to daughters. Pt stated they will go to urgent care tomorrow.

## 2020-04-02 NOTE — ED Notes (Signed)
Spanish interpretor utilized by this Theme park manager EDT to explain to patient the reason for the delay getting her back to a treatment room (wait time). Staff apologized for patient's wait and ensured she understood we are working to get her seen by a provider as soon as possible. Pt verbalized understanding.

## 2020-06-06 IMAGING — DX DG CHEST 2V
2 series · 2 of 2 positions shown · non-contrast
Comparison: 05/19/2013

CLINICAL DATA: Shortness of breath. Palpitations. Symptoms for 1
day.

EXAM:
CHEST - 2 VIEW

[chest pa]
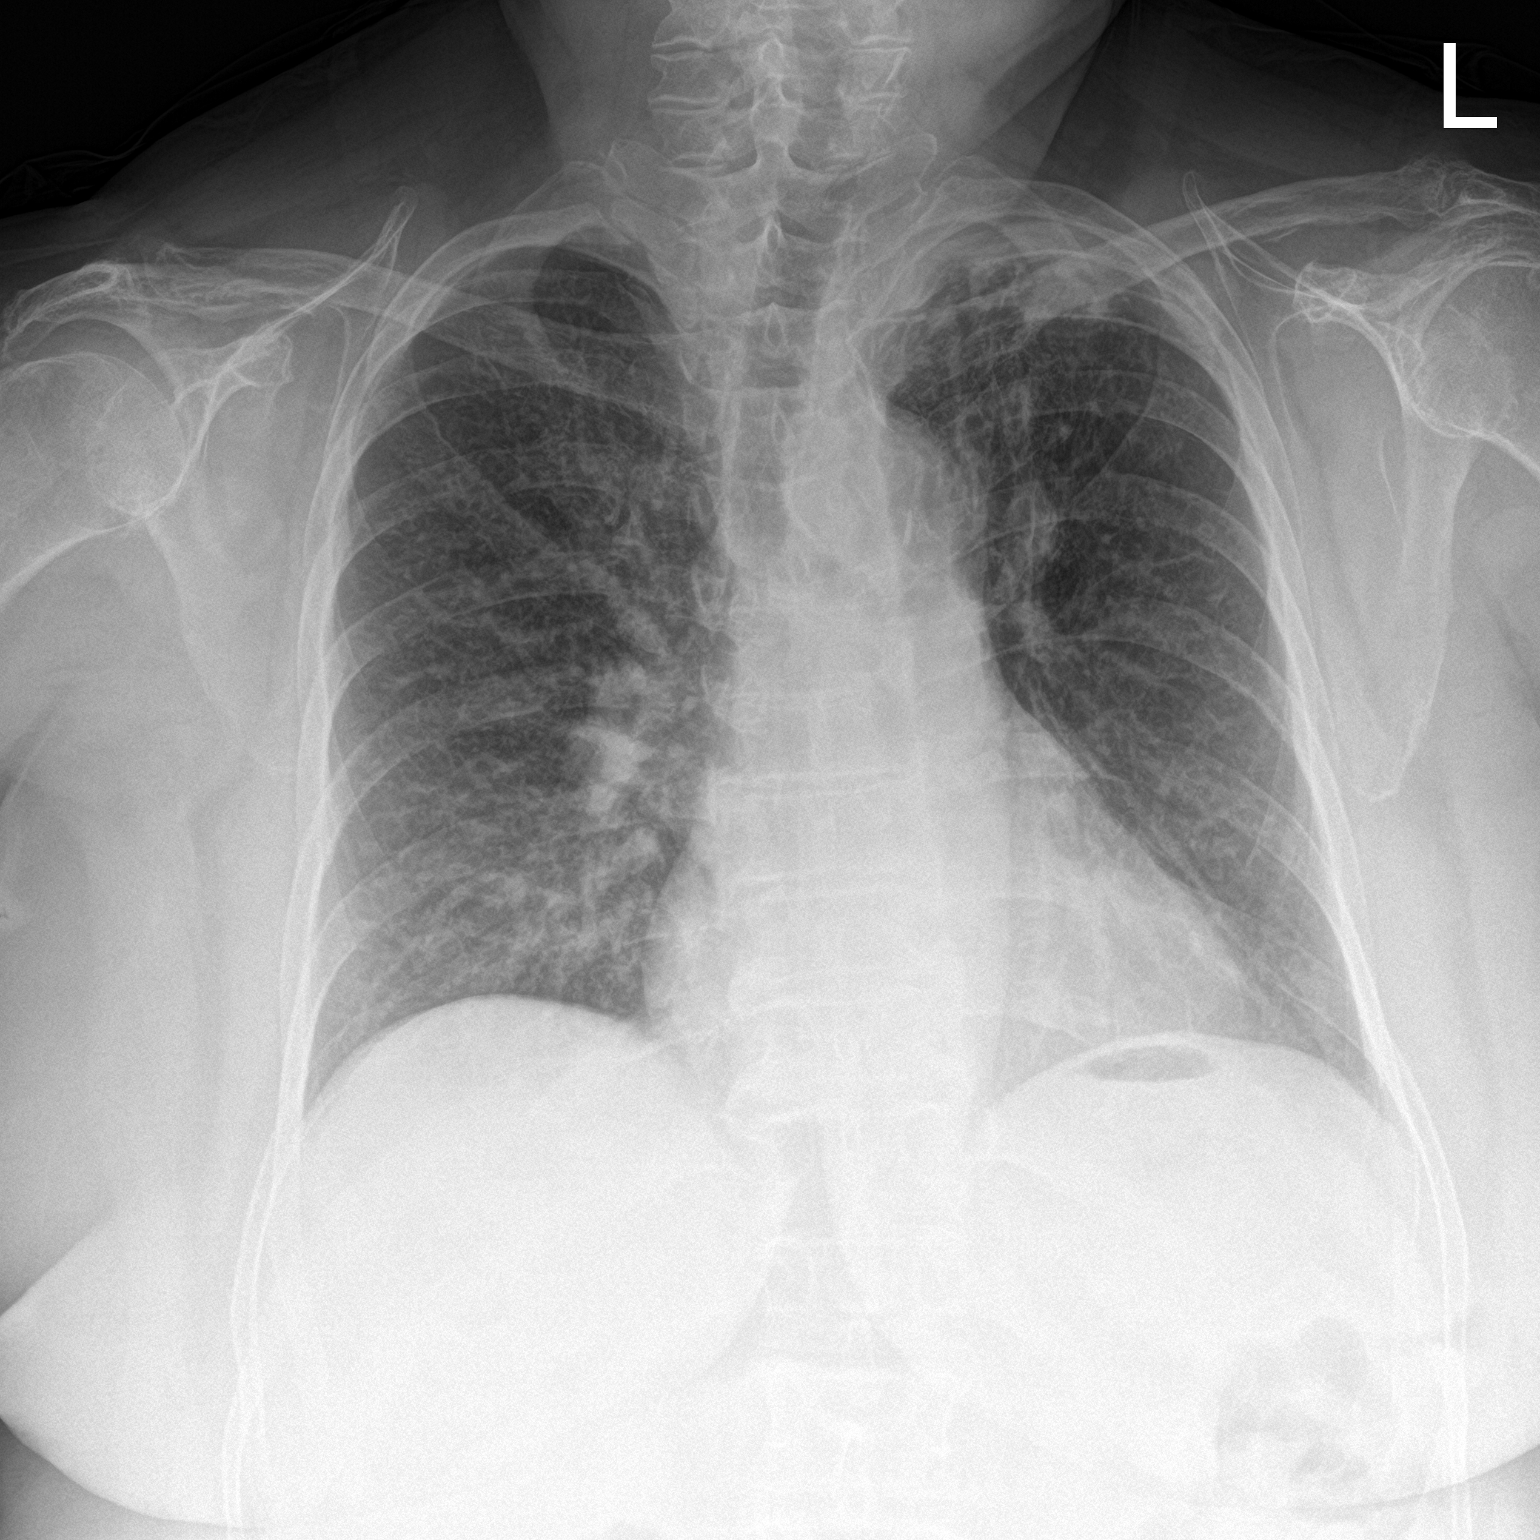

[chest lat]
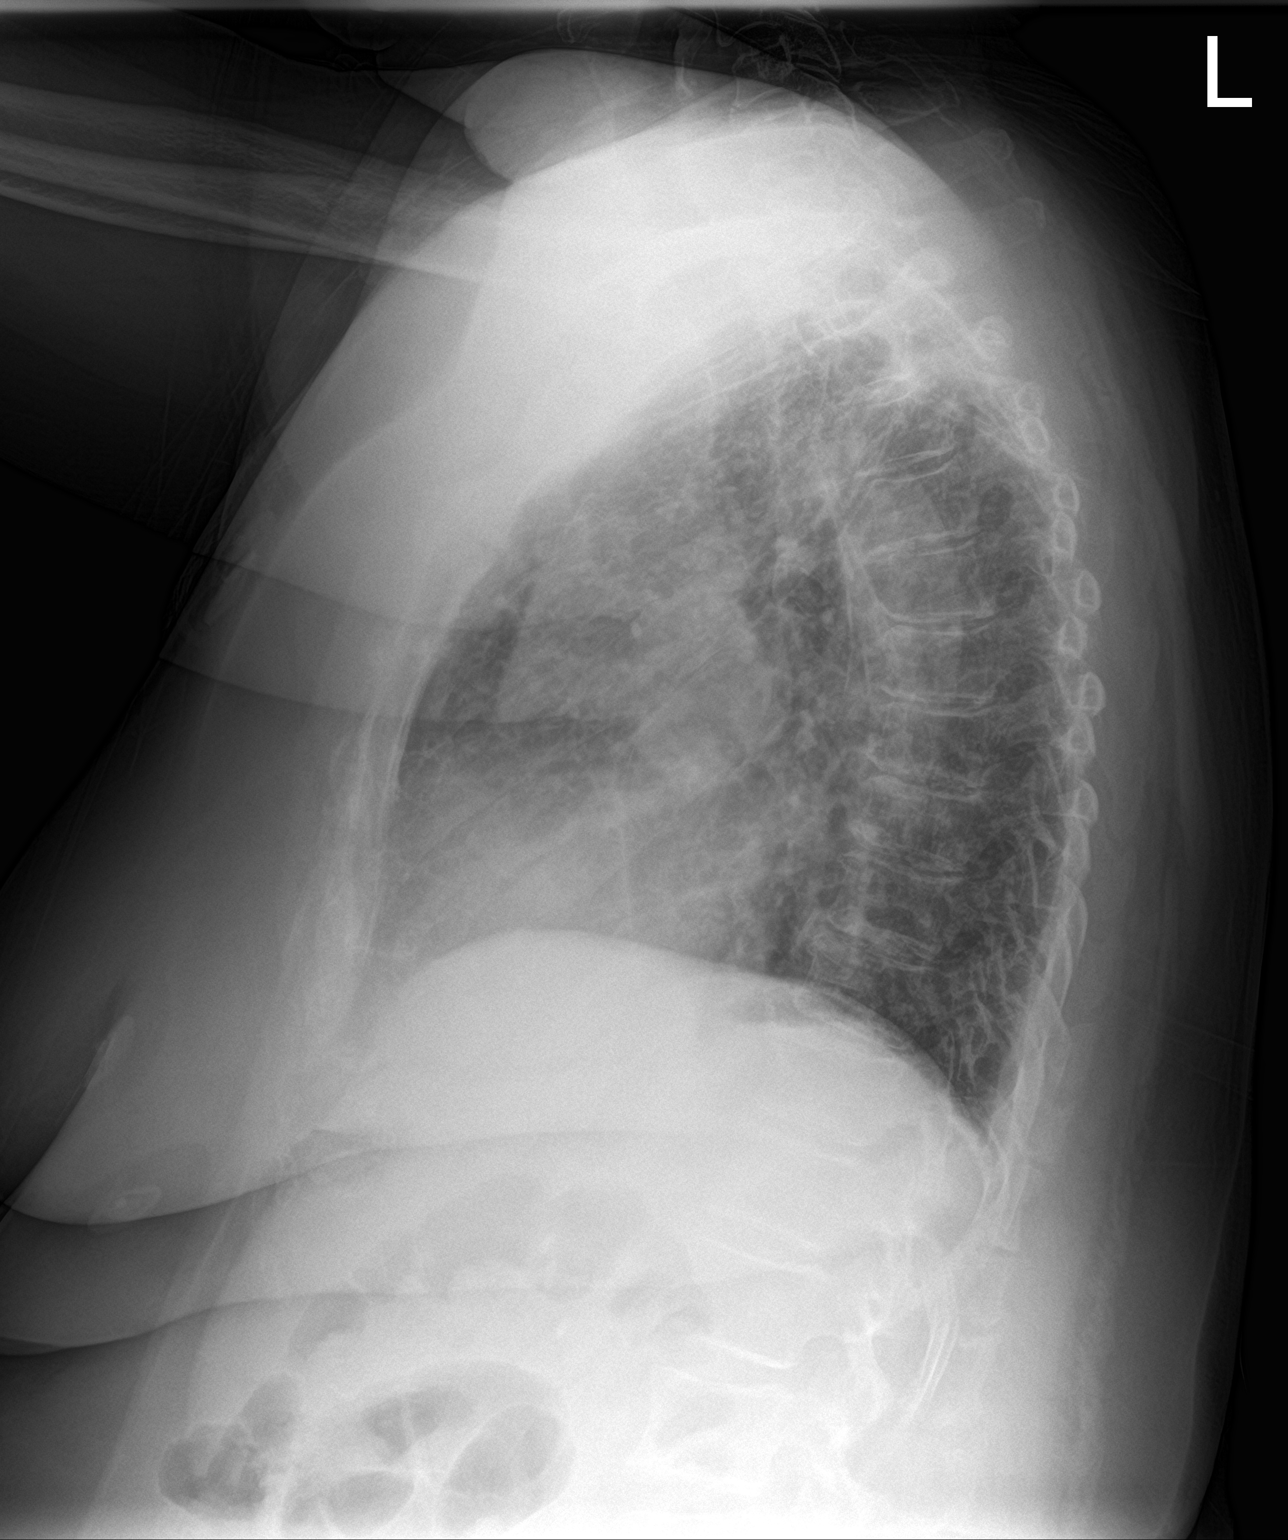

[2 of 2 positions shown; findings below may reference images not displayed]

FINDINGS: The cardiomediastinal contours are normal. Peribronchial thickening.
Stable left apical pleuroparenchymal scarring. Pulmonary vasculature
is normal. No consolidation, pleural effusion, or pneumothorax. No
acute osseous abnormalities are seen.
IMPRESSION: 1. Peribronchial thickening, can be seen with bronchitis or asthma.
2. Stable left apical pleuroparenchymal scarring since [DATE].

## 2020-07-24 ENCOUNTER — Other Ambulatory Visit: Payer: Self-pay

## 2020-07-24 ENCOUNTER — Ambulatory Visit (HOSPITAL_COMMUNITY)
Admission: EM | Admit: 2020-07-24 | Discharge: 2020-07-24 | Disposition: A | Discharge status: Home / Self Care | Payer: Medicare Other | Attending: Emergency Medicine | Admitting: Emergency Medicine

## 2020-07-24 ENCOUNTER — Encounter (HOSPITAL_COMMUNITY): Payer: Self-pay

## 2020-07-24 DIAGNOSIS — M549 Dorsalgia, unspecified: Secondary | ICD-10-CM

## 2020-07-24 DIAGNOSIS — K219 Gastro-esophageal reflux disease without esophagitis: Secondary | ICD-10-CM | POA: Insufficient documentation

## 2020-07-24 DIAGNOSIS — M545 Low back pain, unspecified: Secondary | ICD-10-CM

## 2020-07-24 DIAGNOSIS — R109 Unspecified abdominal pain: Secondary | ICD-10-CM | POA: Diagnosis not present

## 2020-07-24 DIAGNOSIS — R350 Frequency of micturition: Secondary | ICD-10-CM | POA: Diagnosis not present

## 2020-07-24 LAB — POCT URINALYSIS DIPSTICK, ED / UC
Bilirubin Urine: NEGATIVE
Glucose, UA: NEGATIVE mg/dL
Ketones, ur: NEGATIVE mg/dL
Nitrite: NEGATIVE
Protein, ur: NEGATIVE mg/dL
Specific Gravity, Urine: 1.015 (ref 1.005–1.030)
Urobilinogen, UA: 0.2 mg/dL (ref 0.0–1.0)
pH: 6.5 (ref 5.0–8.0)

## 2020-07-24 MED ORDER — OMEPRAZOLE 20 MG PO CPDR
20.0000 mg | DELAYED_RELEASE_CAPSULE | Freq: Every day | ORAL | 0 refills | Status: DC
Start: 2020-07-24 — End: 2020-12-22

## 2020-07-24 MED ORDER — ACETAMINOPHEN 500 MG PO TABS
1000.0000 mg | ORAL_TABLET | Freq: Three times a day (TID) | ORAL | 0 refills | Status: DC | PRN
Start: 2020-07-24 — End: 2022-08-08

## 2020-07-24 MED ORDER — SULFAMETHOXAZOLE-TRIMETHOPRIM 800-160 MG PO TABS: 1.0000 | ORAL_TABLET | Freq: Two times a day (BID) | ORAL | 0 refills | Status: AC

## 2020-07-24 MED ORDER — TIZANIDINE HCL 2 MG PO TABS: 2.0000 mg | ORAL_TABLET | Freq: Every evening | ORAL | 0 refills | Status: DC | PRN

## 2020-07-24 NOTE — Discharge Instructions (Signed)
Drink plenty of water to empty bladder regularly.  Continue with your  blood pressure medications as your blood pressure is elevated today. Please follow up with a primary care provider for recheck of your symptoms and blood pressure.  See provided information about food choices which may help with your bloating, stomach and chest pain.  Start omeprazole daily, take every morning.  Tylenol every 8 hours for back pain. Tizanidine at night as needed for back pain.  I have started antibiotics for concern for urinary tract infection. A culture is pending to confirm this.  Please return if any symptoms worsen.    Beba mucha agua para vaciar la vejiga con regularidad. Contine con sus medicamentos para la presin arterial ya que su presin arterial est elevada hoy. Haga un seguimiento con un proveedor de atencin primaria para Location manager a Chief Technology Officer sus sntomas y presin arterial. Consulte la informacin proporcionada sobre las opciones de alimentos que pueden ayudar con la hinchazn, el estmago y Conservation officer, historic buildings de [place]. Comience con omeprazol a diario, tmelo todas las maanas. Tylenol cada 8 horas para el dolor de espalda. Tizanidina por la noche segn sea necesario para el dolor de espalda. Comenc con antibiticos por preocupacin por una infeccin del tracto urinario. Queda pendiente una cultura para confirmarlo. Regrese si los sntomas empeoran.

## 2020-07-24 NOTE — ED Provider Notes (Addendum)
[place]    CSN: [id number] Arrival date & time: 07/24/20  1007      History   Chief Complaint Chief Complaint  Patient presents with   Back Pain   Abdominal Pain    HPI Brenda Wyatt is a 69 y.o. female.   Brenda Wyatt presents with complaints of back pain. History of kidney stones. Pain radiates around to abdomen. Feels bloated and with "burning" to abdomen. Eating causes increased bloating. She has felt intermittently feverish. Symptoms started two weeks ago. Currently feels the discomfort while sitting. Bilateral back/ flank. Right>Left. Back pain is worse with activity such as walking.  No pain with urination. No blood to urine. Had burning with urination a few weeks ago, but this has resolved. Does feel urinary frequency, has to wake at night to go to the restroom, which is new for her. Doesn't follow with a PCP. Still taking her BP medications, takes it at night, hasn't taken today. Does have some nausea and epigastric pain, which is constant. This is worse with eating. Belches with some reflux, but no true emesis. Has had procedures related to kidney stones in the past, years ago. Denies any vaginal symptoms or low abdominal pain.   Daughter-in-law present and provides spanish interpretation per patient request.   ROS per HPI, negative if not otherwise mentioned.      Past Medical History:  Diagnosis Date   Hypertension     Patient Active Problem List   Diagnosis Date Noted   Lower extremity edema 07/17/2014   Polyuria 07/17/2014   Acanthosis nigricans 07/17/2014   ENDOGENOUS OBESITY 05/31/2009   CONTACT DERMATITIS&OTHER ECZEMA DUE UNSPEC CAUSE 05/31/2009   DEPRESSION 04/26/2009   HYPERTENSION 04/26/2009   GASTRITIS 04/26/2009   CARDIAC MURMUR 04/26/2009   COUGH 04/26/2009    History reviewed. No pertinent surgical history.  OB History   No obstetric history on file.      Home Medications    Prior to  Admission medications   Medication Sig Start Date End Date Taking? Authorizing Provider  acetaminophen (TYLENOL) 500 MG tablet Take 2 tablets (1,000 mg total) by mouth every 8 (eight) hours as needed for moderate pain. 07/24/20   Zigmund Gottron, NP  hydrochlorothiazide (HYDRODIURIL) 25 MG tablet Take 1 tablet (25 mg total) by mouth daily. 07/17/14   Willeen Niece, MD  metoprolol tartrate (LOPRESSOR) 25 MG tablet Take 1 tablet (25 mg total) by mouth 2 (two) times daily. 07/17/14   Willeen Niece, MD  omeprazole (PRILOSEC) 20 MG capsule Take 1 capsule (20 mg total) by mouth daily. 07/24/20   Zigmund Gottron, NP  sulfamethoxazole-trimethoprim (BACTRIM DS) 800-160 MG tablet Take 1 tablet by mouth 2 (two) times daily for 3 days. 07/24/20 07/27/20  Zigmund Gottron, NP  tiZANidine (ZANAFLEX) 2 MG tablet Take 1 tablet (2 mg total) by mouth at bedtime as needed for muscle spasms. 07/24/20   Zigmund Gottron, NP    Family History History reviewed. No pertinent family history.  Social History Social History   Tobacco Use   Smoking status: Never Smoker   Smokeless tobacco: Never Used  Substance Use Topics   Alcohol use: Not on file   Drug use: Not on file     Allergies   Ace inhibitors and Penicillins   Review of Systems Review of Systems   Physical Exam Triage Vital Signs ED Triage Vitals  Enc Vitals Group     BP 07/24/20 1034 (!) 187/75  Pulse Rate 07/24/20 1034 73     Resp 07/24/20 1034 16     Temp 07/24/20 1034 98 F (36.7 C)     Temp Source 07/24/20 1034 Oral     SpO2 07/24/20 1034 99 %     Weight --      Height --      Head Circumference --      Peak Flow --      Pain Score 07/24/20 1044 1     Pain Loc --      Pain Edu? --      Excl. in [place]? --    No data found.  Updated Vital Signs BP (!) 187/75 (BP Location: Left Arm)    Pulse 73    Temp 98 F (36.7 C) (Oral)    Resp 16    SpO2 99%   Visual Acuity Right Eye Distance:   Left Eye Distance:   Bilateral  Distance:    Right Eye Near:   Left Eye Near:    Bilateral Near:     Physical Exam Constitutional:      General: She is not in acute distress.    Appearance: She is well-developed.  HENT:     Head: Normocephalic and atraumatic.  Cardiovascular:     Rate and Rhythm: Normal rate.  Pulmonary:     Effort: Pulmonary effort is normal.  Abdominal:     Palpations: Abdomen is soft.     Tenderness: There is abdominal tenderness in the epigastric area. There is no right CVA tenderness, left CVA tenderness or guarding.  Musculoskeletal:     Lumbar back: Tenderness present. No bony tenderness.       Back:     Comments: Right mid/low back pain on palpation without bony tenderness  Skin:    General: Skin is warm and dry.  Neurological:     Mental Status: She is alert and oriented to person, place, and time.     EKG:  NSR rate of 63.  Previous EKG was available for review. No acute stwave changes as interpreted by me.  New stdepression at v1 without reciprocol changes noted, as compared to ekg on 04/02/20 UC Treatments / Results  Labs (all labs ordered are listed, but only abnormal results are displayed) Labs Reviewed  POCT URINALYSIS DIPSTICK, ED / UC - Abnormal; Notable for the following components:      Result Value   Hgb urine dipstick TRACE (*)    Leukocytes,Ua SMALL (*)    All other components within normal limits  URINE CULTURE    EKG   Radiology No results found.  Procedures Procedures (including critical care time)  Medications Ordered in UC Medications - No data to display  Initial Impression / Assessment and Plan / UC Course  I have reviewed the triage vital signs and the nursing notes.  Pertinent labs & imaging results that were available during my care of the patient were reviewed by me and considered in my medical decision making (see chart for details).     At least 2 weeks of symptoms. Some urinary symptoms, with trace leuks and hgb to urine. Culture  obtained with keflex initiated. Back pain on exam is more musculoskeletal in nature, tylenol, tizanidine prn recommended. Reflux symptoms, with omeprazole and dietary changes recommended. Noted elevated BP, emphasized follow up with PCP for recheck and management. Return precautions provided. Patient and family verbalized understanding and agreeable to plan.   Final Clinical Impressions(s) / UC Diagnoses  Final diagnoses:  Urinary frequency  Gastroesophageal reflux disease, unspecified whether esophagitis present  Acute bilateral low back pain without sciatica     Discharge Instructions     Drink plenty of water to empty bladder regularly.  Continue with your  blood pressure medications as your blood pressure is elevated today. Please follow up with a primary care provider for recheck of your symptoms and blood pressure.  See provided information about food choices which may help with your bloating, stomach and chest pain.  Start omeprazole daily, take every morning.  Tylenol every 8 hours for back pain. Tizanidine at night as needed for back pain.  I have started antibiotics for concern for urinary tract infection. A culture is pending to confirm this.  Please return if any symptoms worsen.    Beba mucha agua para vaciar la vejiga con regularidad. Contine con sus medicamentos para la presin arterial ya que su presin arterial est elevada hoy. Haga un seguimiento con un proveedor de atencin primaria para Location manager a Chief Technology Officer sus sntomas y presin arterial. Consulte la informacin proporcionada sobre las opciones de alimentos que pueden ayudar con la hinchazn, el estmago y Conservation officer, historic buildings de [place]. Comience con omeprazol a diario, tmelo todas las maanas. Tylenol cada 8 horas para el dolor de espalda. Tizanidina por la noche segn sea necesario para el dolor de espalda. Comenc con antibiticos por preocupacin por una infeccin del tracto urinario. Queda pendiente una cultura para  confirmarlo. Regrese si los sntomas empeoran.    ED Prescriptions    Medication Sig Dispense Auth. Provider   omeprazole (PRILOSEC) 20 MG capsule Take 1 capsule (20 mg total) by mouth daily. 30 capsule Augusto Gamble B, NP   acetaminophen (TYLENOL) 500 MG tablet Take 2 tablets (1,000 mg total) by mouth every 8 (eight) hours as needed for moderate pain. 30 tablet Augusto Gamble B, NP   tiZANidine (ZANAFLEX) 2 MG tablet Take 1 tablet (2 mg total) by mouth at bedtime as needed for muscle spasms. 20 tablet Augusto Gamble B, NP   sulfamethoxazole-trimethoprim (BACTRIM DS) 800-160 MG tablet Take 1 tablet by mouth 2 (two) times daily for 3 days. 6 tablet Zigmund Gottron, NP     PDMP not reviewed this encounter.   Zigmund Gottron, NP 07/24/20 1124    Augusto Gamble B, NP 07/24/20 1142

## 2020-07-24 NOTE — ED Triage Notes (Signed)
Pt present abdominal pain and back pain, symptoms started over a month ago.  Pt states she is having a burning sensation when she urinate. Pt denies any other symptoms.

## 2020-07-25 LAB — URINE CULTURE

## 2020-11-25 ENCOUNTER — Ambulatory Visit (HOSPITAL_COMMUNITY)
Admission: EM | Admit: 2020-11-25 | Discharge: 2020-11-25 | Disposition: A | Discharge status: Home / Self Care | Payer: Medicare Other

## 2020-11-25 ENCOUNTER — Encounter (HOSPITAL_COMMUNITY): Payer: Self-pay

## 2020-11-25 ENCOUNTER — Other Ambulatory Visit: Payer: Self-pay

## 2020-11-25 DIAGNOSIS — Z760 Encounter for issue of repeat prescription: Secondary | ICD-10-CM | POA: Diagnosis not present

## 2020-11-25 HISTORY — DX: Anxiety disorder, unspecified: F41.9

## 2020-11-25 MED ORDER — HYDROCHLOROTHIAZIDE 25 MG PO TABS: 25.0000 mg | ORAL_TABLET | Freq: Every day | ORAL | 6 refills | Status: DC

## 2020-11-25 MED ORDER — PAROXETINE HCL 20 MG PO TABS: 20.0000 mg | ORAL_TABLET | Freq: Every day | ORAL | 0 refills | Status: DC

## 2020-11-25 NOTE — ED Provider Notes (Signed)
[place]  ____________________________________________  Time seen: Approximately 5:47 PM  I have reviewed the triage vital signs and the nursing notes.   HISTORY  Chief Complaint Medication Refill   Historian Patient     HPI Brenda Wyatt is a 69 y.o. female presents to the urgent care for refill of Paxil and hydrochlorothiazide.  Daughter states that patient is in the process of establishing care with a PCP.  She has no other medical complaints.   Past Medical History:  Diagnosis Date   Anxiety    Hypertension      Immunizations up to date:  Yes.     Past Medical History:  Diagnosis Date   Anxiety    Hypertension     Patient Active Problem List   Diagnosis Date Noted   Lower extremity edema 07/17/2014   Polyuria 07/17/2014   Acanthosis nigricans 07/17/2014   ENDOGENOUS OBESITY 05/31/2009   CONTACT DERMATITIS&OTHER ECZEMA DUE UNSPEC CAUSE 05/31/2009   DEPRESSION 04/26/2009   HYPERTENSION 04/26/2009   GASTRITIS 04/26/2009   CARDIAC MURMUR 04/26/2009   COUGH 04/26/2009    History reviewed. No pertinent surgical history.  Prior to Admission medications   Medication Sig Start Date End Date Taking? Authorizing Provider  acetaminophen (TYLENOL) 500 MG tablet Take 2 tablets (1,000 mg total) by mouth every 8 (eight) hours as needed for moderate pain. 07/24/20   Zigmund Gottron, NP  hydrochlorothiazide (HYDRODIURIL) 25 MG tablet Take 1 tablet (25 mg total) by mouth daily. 11/25/20   Lannie Fields, PA-C  metoprolol tartrate (LOPRESSOR) 25 MG tablet Take 1 tablet (25 mg total) by mouth 2 (two) times daily. 07/17/14   Willeen Niece, MD  omeprazole (PRILOSEC) 20 MG capsule Take 1 capsule (20 mg total) by mouth daily. 07/24/20   Zigmund Gottron, NP  PARoxetine (PAXIL) 20 MG tablet Take 1 tablet (20 mg total) by mouth daily. 11/25/20 12/25/20  Lannie Fields, PA-C  tiZANidine (ZANAFLEX) 2 MG tablet Take 1 tablet (2 mg total) by  mouth at bedtime as needed for muscle spasms. 07/24/20   Zigmund Gottron, NP    Allergies Ace inhibitors and Penicillins  Family History  Family history unknown: Yes    Social History Social History   Tobacco Use   Smoking status: Never Smoker   Smokeless tobacco: Never Used     Review of Systems  Constitutional: No fever/chills Eyes:  No discharge ENT: No upper respiratory complaints. Respiratory: no cough. No SOB/ use of accessory muscles to breath Gastrointestinal:   No nausea, no vomiting.  No diarrhea.  No constipation. Musculoskeletal: Negative for musculoskeletal pain. Skin: Negative for rash, abrasions, lacerations, ecchymosis.   ____________________________________________   PHYSICAL EXAM:  VITAL SIGNS: ED Triage Vitals  Enc Vitals Group     BP 11/25/20 1644 (!) 154/63     Pulse Rate 11/25/20 1644 70     Resp 11/25/20 1644 17     Temp 11/25/20 1644 (!) 97.4 F (36.3 C)     Temp Source 11/25/20 1644 Oral     SpO2 11/25/20 1644 99 %     Weight --      Height --      Head Circumference --      Peak Flow --      Pain Score 11/25/20 1643 0     Pain Loc --      Pain Edu? --      Excl. in [place]? --      Constitutional: Alert  and oriented. Well appearing and in no acute distress. Eyes: Conjunctivae are normal. PERRL. EOMI. Head: Atraumatic. ENT:      Nose: No congestion/rhinnorhea.      Mouth/Throat: Mucous membranes are moist.  Neck: No stridor.  No cervical spine tenderness to palpation. Cardiovascular: Normal rate, regular rhythm. Normal S1 and S2.  Good peripheral circulation. Respiratory: Normal respiratory effort without tachypnea or retractions. Lungs CTAB. Good air entry to the bases with no decreased or absent breath sounds Gastrointestinal: Bowel sounds x 4 quadrants. Soft and nontender to palpation. No guarding or rigidity. No distention. Musculoskeletal: Full range of motion to all extremities. No obvious deformities noted Neurologic:   Normal for age. No gross focal neurologic deficits are appreciated.  Skin:  Skin is warm, dry and intact. No rash noted. Psychiatric: Mood and affect are normal for age. Speech and behavior are normal.   ____________________________________________   LABS (all labs ordered are listed, but only abnormal results are displayed)  Labs Reviewed - No data to display ____________________________________________  EKG   ____________________________________________  RADIOLOGY   No results found.  ____________________________________________    PROCEDURES  Procedure(s) performed:     Procedures     Medications - No data to display   ____________________________________________   INITIAL IMPRESSION / ASSESSMENT AND PLAN / ED COURSE  Pertinent labs & imaging results that were available during my care of the patient were reviewed by me and considered in my medical decision making (see chart for details).   Assessment and plan Medication refill 69 year old female presents to the urgent care for medication refill of hydrochlorothiazide and Paxil.  Request was granted.  Patient has no questions.        ____________________________________________  FINAL CLINICAL IMPRESSION(S) / ED DIAGNOSES  Final diagnoses:  Medication refill      NEW MEDICATIONS STARTED DURING THIS VISIT:  ED Discharge Orders         Ordered    hydrochlorothiazide (HYDRODIURIL) 25 MG tablet  Daily        11/25/20 1743    PARoxetine (PAXIL) 20 MG tablet  Daily        11/25/20 1743              This chart was dictated using voice recognition software/Dragon. Despite best efforts to proofread, errors can occur which can change the meaning. Any change was purely unintentional.      Lannie Fields, PA-C 11/25/20 1748

## 2020-11-25 NOTE — ED Triage Notes (Signed)
Pt presents for medication refill of paxil and hydrochloriathiazide; pt is awaiting appt to establish primary care.

## 2020-11-25 NOTE — Discharge Instructions (Addendum)
Take Paxil and hydrochlorothiazide as directed

## 2020-12-22 ENCOUNTER — Encounter: Payer: Self-pay | Admitting: Family Medicine

## 2020-12-22 ENCOUNTER — Ambulatory Visit (INDEPENDENT_AMBULATORY_CARE_PROVIDER_SITE_OTHER): Payer: Medicare Other | Admitting: Family Medicine

## 2020-12-22 ENCOUNTER — Other Ambulatory Visit: Payer: Self-pay

## 2020-12-22 VITALS — BP 152/68 | HR 78 | Ht 60.0 in | Wt 215.6 lb

## 2020-12-22 DIAGNOSIS — I10 Essential (primary) hypertension: Secondary | ICD-10-CM | POA: Diagnosis not present

## 2020-12-22 DIAGNOSIS — Z5320 Procedure and treatment not carried out because of patient's decision for unspecified reasons: Secondary | ICD-10-CM

## 2020-12-22 DIAGNOSIS — Z7689 Persons encountering health services in other specified circumstances: Secondary | ICD-10-CM | POA: Diagnosis not present

## 2020-12-22 DIAGNOSIS — F419 Anxiety disorder, unspecified: Secondary | ICD-10-CM | POA: Diagnosis not present

## 2020-12-22 DIAGNOSIS — E785 Hyperlipidemia, unspecified: Secondary | ICD-10-CM | POA: Diagnosis not present

## 2020-12-22 DIAGNOSIS — E7800 Pure hypercholesterolemia, unspecified: Secondary | ICD-10-CM

## 2020-12-22 DIAGNOSIS — Z1159 Encounter for screening for other viral diseases: Secondary | ICD-10-CM | POA: Diagnosis not present

## 2020-12-22 DIAGNOSIS — K219 Gastro-esophageal reflux disease without esophagitis: Secondary | ICD-10-CM | POA: Diagnosis not present

## 2020-12-22 MED ORDER — HYDROCHLOROTHIAZIDE 25 MG PO TABS: 25.0000 mg | ORAL_TABLET | Freq: Every day | ORAL | 6 refills | Status: DC

## 2020-12-22 MED ORDER — OMEPRAZOLE 20 MG PO CPDR: 20.0000 mg | DELAYED_RELEASE_CAPSULE | Freq: Every day | ORAL | 0 refills | Status: AC

## 2020-12-22 MED ORDER — AMLODIPINE BESYLATE-VALSARTAN 5-160 MG PO TABS: 1.0000 | ORAL_TABLET | Freq: Every day | ORAL | 3 refills | Status: DC

## 2020-12-22 NOTE — Progress Notes (Signed)
    SUBJECTIVE:   CHIEF COMPLAINT / HPI:   Patient initially presents to the clinic accompanied by her granddaughter to establish care. States that she moved to [place] 2 years ago, prior to this was living in [place].   Hypertension Patient reports she is compliant on amlodipine 5 mg and HCTZ 25 mg daily. She never skips taking her antihypertensives and does not check BP at home since she does not have a blood pressure cuff. Denies chest pain and dyspnea. States that when she recently visited [place] in November 2021, they did a full work up including EKG which she reports were all normal.   GERD Endorsing epigastric pain and reflux occasionally after meals, states that this does not influence her appetite. Denies nausea, vomiting and globus sensation. Takes omeprazole 20 mg daily and shares that overall medication helps relieve her symptoms. Reports that she sometimes eats right before going to bed which further exacerbates her symptoms.   Stress Patient reports that she occasionally experiences palpitations. She lives with her husband, son, daughter-in-law and 3 granddaughters. States that her husband and the rest of her family serve as a strong support system but she worries about her son who is current incarcerated. Reports that she can't help worrying about him. Also sometimes feels down due to [place] she is unable to go to church regularly and see other family. Family tries to avoid going out as much in order to prevent further potential COVID exposure. Endorses good mood but at times gets sad over these things. Per chart review, patient was previously on paroxetine 20 mg daily but is not taking currently.   OBJECTIVE:   BP (!) 152/68   Pulse 78   Ht 5' (1.524 m)   Wt 215 lb 9.6 oz (97.8 kg)   SpO2 99%   BMI 42.11 kg/m   General: Patient well-appearing, in no acute distress. HEENT: normocephalic, atraumatic, non-tender thyroid, no lymphadenopathy CV: RRR, no murmurs or gallops  auscultated Resp: lungs clear to auscultation bilaterally Abodmen: soft, nontender, presence of active bowel sounds Ext: no LE edema noted bilaterally, radial and dorsalis pedis pulses strong and equal bilaterally  Neuro: normal gait, alert and conversational Psych: mood appropriate, denies SI  ASSESSMENT/PLAN:   Essential hypertension -BP 1582/68 -continue HCTZ and amlodipine, refills provided -diet and exercise counseling -f/u 1 month for BP recheck and adjust antihypertensives as needed  GERD without esophagitis -continue omeprazole, refill provided -GERD precautions discussed  -consider H pylori testing at next visit if symptoms worsen   Anxiety -discussed no-pharmacologic ways of coping with stress including engaging in hobbies, medication, yoga -information given on opportunities for therapy and psychologists in the area with Spanish interpretation  -f/u for mood check in 1 month  Hypercholesteremia -lipid panel 12/22/2020 notable for total cholesterol 228 and LDL 139, recommended statin therapy given 14% 10 year risk -started on atorvastatin 10 mg daily -diet and exercise counseling  Health maintenance  -Patient reports having mammogram and colonoscopy performed recently, instructed to bring records of these to her next appointment.  -Consider pneumococcal and Tdap vaccines at next appointment.  Med rec and PHQ-9 score of 9 reviewed.   Assistance from in-person [place], [place], with [facility] used throughout the entirety of the encounter.   Donney Dice, [place]

## 2020-12-22 NOTE — Patient Instructions (Addendum)
It was great seeing you today!  Today we discussed hypertension, reflux and stress. Please take your blood pressure reducing medications daily. Regarding reflux, avoid alcohol, spicy foods and sodas. Please try to eat at least 3 hours before bedtime to avoid worsening of reflux symptoms. Please bring your records for mammogram and colonoscopy at your next visit. I have given refills for amlodipine, omeprazole and HCTZ.  Please follow up in 1 month for your next scheduled appointment, if anything arises between now and then, please don't hesitate to contact our office.   Thank you for allowing [place] to be a part of your medical care!  Thank you, Dr. Larae Grooms    To find a therapist visit [email or web address]. At this website you will be able to filter which providers take your click your insurance as well as other filters to fit your needs.They have options for those that speak Spanish    Opciones de alimentos para pacientes adultos con enfermedad de reflujo gastroesofgico Food Choices for Gastroesophageal Reflux Disease, Adult Si tiene enfermedad de reflujo gastroesofgico (ERGE), los alimentos que consume y los hbitos de alimentacin son Theatre stage manager. Elegir los alimentos adecuados puede ayudar a [employer]. Piense en consultar a un experto en alimentacin (nutricionista) para que lo ayude a Warehouse manager. Consejos para seguir Photographer las etiquetas de los alimentos  Elija alimentos que tengan bajo contenido de grasas saturadas. Los alimentos que pueden ayudar con los sntomas [employer] siguientes: ? Alimentos que tienen menos del 5% de los valores diarios (VD) de grasa. ? Alimentos que tienen 0 gramos de grasas trans. Al cocinar  No frer los alimentos.  Cocinar la comida al horno, al vapor, a la plancha o en la parrilla. Estos son mtodos que no necesitan mucha grasa para Audiological scientist.  Para agregar sabor, trate de consumir hierbas con bajo contenido de  picante y [place]. Planificacin de las comidas  Elegir alimentos saludables con bajo contenido de [place], por ejemplo: ? Frutas y vegetales. ? Cereales integrales. ? Productos lcteos con bajo contenido de grasa. ? Lysbeth Galas, pescado y aves.  Haga comidas pequeas frecuentes en lugar de 3 comidas abundantes al da. Coma lentamente, en un lugar donde est relajado. Evite agacharse o recostarse hasta 2 o 3horas despus de haber comido.  Limite los alimentos con alto contenido graso como las carnes grasas o los alimentos fritos.  Limite su ingesta de alimentos grasos, como aceites, [place] y [place].  Evite lo siguiente si el mdico se lo indica: ? Consumir alimentos que le ocasionen sntomas. Pueden ser distintos para cada persona. Lleve un registro de los alimentos para identificar aquellos que le causen sntomas. ? Alcohol. ? Beber mucha cantidad de lquido con las comidas. ? Comer 2 o 3 horas antes de acostarse.   Estilo de vida  Mantenga un peso saludable. Pregntele a su mdico cul es el peso saludable para usted. Si necesita perder peso, hable con su mdico para hacerlo de manera segura.  Haga actividad fsica durante, al menos, 30 minutos 5 das por semana o ms, o como se lo haya indicado el mdico.  Use ropa suelta.  No fume ni consuma ningn producto que contenga nicotina o tabaco. Si necesita ayuda para dejar de fumar, consulte al mdico.  Duerma con la cabecera de la cama ms elevada que los pies. Use una cua debajo del colchn o bloques debajo del armazn de la cama para Theatre manager la cabecera de la cama elevada.  Mastique chicle sin azcar  despus de las comidas. Qu alimentos debo comer? Siga una dieta saludable y bien equilibrada que incluya abundantes frutas, verduras, cereales integrales, productos lcteos descremados, carnes magras, pescado y aves. Cada persona es diferente. Los alimentos que pueden causar sntomas en una persona pueden no causar sntomas en  otra persona. Trabaje con el mdico para hallar alimentos que sean seguros para usted. Es posible que los productos que se enumeran ms [place] no constituyan una lista completa de lo que puede comer y Electronics engineer. Pngase en contacto con un experto en alimentos para conocer ms opciones.   Qu alimentos debo evitar? Limitar algunos de estos alimentos puede ayudar a Chief Technology Officer los sntomas de [place]. Cada persona es diferente. Consulte a un experto en alimentacin o a su mdico para que lo ayude a Pension scheme manager los alimentos exactos que debe evitar, si los hubiere. Frutas Cualquier fruta que est preparada con grasa agregada. Cualquier fruta que le ocasione sntomas. Para algunas personas, estas pueden incluir, las frutas ctricas como naranja, pomelo, pia y limn. Verduras Verduras fritas en abundante aceite. Papas fritas. Cualquier verdura que est preparada con grasa agregada. Cualquier verdura que le ocasione sntomas. Para algunas personas, estas pueden incluir tomates y productos con tomate, [place], cebollas y [place], y rbanos picantes. Granos Pasteles o panes sin levadura con grasa agregada. Carnes y otras protenas Carnes de alto contenido graso como carne grasa de vaca o cerdo, salchichas, costillas, jamn, salchicha, salame y tocino. Carnes o protenas fritas, lo que incluye pescado frito y pollo frito. Frutos secos y Engineer, mining de frutos secos, en grandes cantidades. Lcteos Leche entera y Dime Box con chocolate. Phoebe Sharps. Crema. Helado. Queso crema. Batidos con [employer]. Grasas y [employer]. Margarina. Lardo. Mantequilla clarificada. Bebidas Caf y t negro, con o sin cafena. Bebidas con gas. Refrescos. Bebidas energizantes. Jugo de fruta hecho con frutas cidas, como naranja o pomelo. Jugo de tomate. Bebidas alcohlicas. Dulces y postres Chocolate y cacao. Rosquillas. Alios y condimentos Pimienta. Menta y mentol. Sal agregada. Cualquier condimento, hierbas o aderezos que le ocasionen  sntomas. Para algunas personas, esto puede incluir curry, salsa picante o aderezos para ensalada a base de vinagre. Es posible que los productos que se enumeran ms arriba no constituyan una lista completa de lo que no debera comer y Electronics engineer. Pngase en contacto con un experto en alimentos para conocer ms opciones. Preguntas para hacerle al [employer] Los cambios en la dieta y en el estilo de vida a menudo son los primeros pasos que se toman para Air cabin crew los sntomas de [place]. Si los [employer] dieta y el estilo de vida no ayudan, hable con el mdico sobre el uso de medicamentos. Dnde buscar ms informacin  Control and instrumentation engineer for Gastrointestinal Disorders (Fundacin Internacional para los Trastornos Gastrointestinales): aboutgerd.org Resumen  Si tiene [employer], las elecciones de alimentos y el [place] de vida son muy importantes para ayudar a Herbalist sntomas.  Haga comidas pequeas durante Psychiatrist de 3 comidas abundantes. Coma lentamente y en un lugar donde est relajado.  Evite agacharse o recostarse hasta 2 o 3horas despus de haber comido.  Limite los alimentos con alto contenido graso como las carnes grasas o los alimentos fritos. Esta informacin no tiene Marine scientist el consejo del mdico. Asegrese de hacerle al mdico cualquier pregunta que tenga. Document Revised: 07/02/2020 Document Reviewed: 07/02/2020 Elsevier Patient Education  [place].

## 2020-12-23 ENCOUNTER — Encounter: Payer: Self-pay | Admitting: Family Medicine

## 2020-12-23 ENCOUNTER — Other Ambulatory Visit: Payer: Self-pay | Admitting: Family Medicine

## 2020-12-23 DIAGNOSIS — E7800 Pure hypercholesterolemia, unspecified: Secondary | ICD-10-CM | POA: Insufficient documentation

## 2020-12-23 DIAGNOSIS — K219 Gastro-esophageal reflux disease without esophagitis: Secondary | ICD-10-CM | POA: Insufficient documentation

## 2020-12-23 DIAGNOSIS — F419 Anxiety disorder, unspecified: Secondary | ICD-10-CM | POA: Insufficient documentation

## 2020-12-23 LAB — COMPREHENSIVE METABOLIC PANEL
ALT: 20 IU/L (ref 0–32)
AST: 22 IU/L (ref 0–40)
Albumin/Globulin Ratio: 1.2 (ref 1.2–2.2)
Albumin: 4.1 g/dL (ref 3.8–4.8)
Alkaline Phosphatase: 125 IU/L — ABNORMAL HIGH (ref 44–121)
BUN/Creatinine Ratio: 27 (ref 12–28)
BUN: 20 mg/dL (ref 8–27)
Bilirubin Total: 0.3 mg/dL (ref 0.0–1.2)
CO2: 23 mmol/L (ref 20–29)
Calcium: 8.4 mg/dL — ABNORMAL LOW (ref 8.7–10.3)
Chloride: 100 mmol/L (ref 96–106)
Creatinine, Ser: 0.74 mg/dL (ref 0.57–1.00)
GFR calc Af Amer: 96 mL/min/{1.73_m2} (ref >59)
GFR calc non Af Amer: 84 mL/min/{1.73_m2} (ref >59)
Globulin, Total: 3.5 g/dL (ref 1.5–4.5)
Glucose: 104 mg/dL — ABNORMAL HIGH (ref 65–99)
Potassium: 3.9 mmol/L (ref 3.5–5.2)
Sodium: 138 mmol/L (ref 134–144)
Total Protein: 7.6 g/dL (ref 6.0–8.5)

## 2020-12-23 LAB — HEPATITIS C ANTIBODY: Hep C Virus Ab: <0.1 s/co ratio (ref 0.0–0.9)

## 2020-12-23 LAB — LIPID PANEL
Chol/HDL Ratio: 3.3 ratio (ref 0.0–4.4)
Cholesterol, Total: 228 mg/dL — ABNORMAL HIGH (ref 100–199)
HDL: 69 mg/dL (ref >39)
LDL Chol Calc (NIH): 139 mg/dL — ABNORMAL HIGH (ref 0–99)
Triglycerides: 113 mg/dL (ref 0–149)
VLDL Cholesterol Cal: 20 mg/dL (ref 5–40)

## 2020-12-23 MED ORDER — ATORVASTATIN CALCIUM 10 MG PO TABS: 10.0000 mg | ORAL_TABLET | Freq: Every day | ORAL | 3 refills | Status: DC

## 2020-12-23 NOTE — Assessment & Plan Note (Signed)
-  BP 1582/68 -continue HCTZ and amlodipine, refills provided -diet and exercise counseling -f/u 1 month for BP recheck and adjust antihypertensives as needed

## 2020-12-23 NOTE — Assessment & Plan Note (Addendum)
-  lipid panel 12/22/2020 notable for total cholesterol 228 and LDL 139, recommended statin therapy given 14% 10 year risk -started on atorvastatin 10 mg daily -diet and exercise counseling

## 2020-12-23 NOTE — Assessment & Plan Note (Signed)
-  discussed no-pharmacologic ways of coping with stress including engaging in hobbies, medication, yoga -information given on opportunities for therapy and psychologists in the area with Spanish interpretation  -f/u for mood check in 1 month

## 2020-12-23 NOTE — Assessment & Plan Note (Addendum)
-  continue omeprazole, refill provided -GERD precautions discussed  -consider H pylori testing at next visit if symptoms worsen

## 2021-01-19 ENCOUNTER — Other Ambulatory Visit: Payer: Self-pay

## 2021-01-19 ENCOUNTER — Ambulatory Visit (INDEPENDENT_AMBULATORY_CARE_PROVIDER_SITE_OTHER): Payer: Medicare Other | Admitting: Family Medicine

## 2021-01-19 ENCOUNTER — Encounter: Payer: Self-pay | Admitting: Family Medicine

## 2021-01-19 VITALS — BP 128/80 | HR 76 | Wt 214.2 lb

## 2021-01-19 DIAGNOSIS — M79604 Pain in right leg: Secondary | ICD-10-CM

## 2021-01-19 NOTE — Patient Instructions (Signed)
It was good to see you today.  Thank you for coming in.  I think you have some muscle pain.  I do not believe you have a bone injury or fracture or a nerve injury.  I recommend taking Tylenol and Ibuprofen, you can also try compression stockings to help with swelling in your legs.    You should be better in 2 weeks.  If you are not better by then or if you have significantly worse swelling, pain, or then come back to see [place].  If you have difficulty breathing or any chest pain, please come back and see [place].   Be Well, Dr Manus Rudd

## 2021-01-20 NOTE — Progress Notes (Signed)
SUBJECTIVE:   CHIEF COMPLAINT / HPI: Leg swelling/pain  Patient complains of leg swelling and right hip and leg pain going on for 3 days.  Does not recall any trauma previously.  Describes pain as dull and achy.  Still able to walk even though there is some pain.   Indicates she had previous episode of this in the fall in [place] for which she took medication and it resolved.  Does not recall name of medication. medication did not make her pee more frequently.  Has not been on any recent long car rides or plane flights and ambulates frequently.  Indicates she had ECHO previously and it was normal.  Has not started any recent new medications.  Patient also endorses nausea that has been going on over this time period but has not vomited or had diarrhea.  No change in urinary or bowel habits.  PERTINENT  PMH / PSH:   OBJECTIVE:   BP 128/80   Pulse 76   Wt 214 lb 3.2 oz (97.2 kg)   SpO2 97%   BMI 41.83 kg/m    Physical Exam Constitutional:      General: She is not in acute distress. HENT:     Head: Normocephalic and atraumatic.     Mouth/Throat:     Mouth: Mucous membranes are moist.  Cardiovascular:     Rate and Rhythm: Normal rate and regular rhythm.     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     Breath sounds: Normal breath sounds.  Abdominal:     General: Abdomen is flat.  Musculoskeletal:     Lumbar back: No swelling, tenderness or bony tenderness. Negative right straight leg raise test and negative left straight leg raise test.     Right hip: No bony tenderness. Normal range of motion. Normal strength.     Left hip: No bony tenderness. Normal range of motion. Normal strength.     Right upper leg: Tenderness present. No swelling.     Left upper leg: No swelling or tenderness.     Right knee: Normal. No swelling.     Left knee: Normal. No swelling.     Right lower leg: Tenderness present.     Left lower leg: No  tenderness.     Comments: Non-pitting edema bilaterally, opain with abduction of right hip, tenderness to palpation located in gluteal, thigh, and calve muscle of right leg  Neurological:     Mental Status: She is alert.     ASSESSMENT/PLAN:     Acute Myositis of right leg Likely cause of patient's leg pain given tenderness to palpation located in gluteal, thigh, and calve muscle of right leg.  Would [place] fit with viral illness causing nausea.  No weakness in leg or numbness or tingling.  Full ROM of leg.  Less concern for joint abnormality, VTE, nerve compression or fracture given location of patient's pain and normal lower extremity exam outside of pain. - Can take OTC Tylenol and Ibuprofen - Reurn if significant swelling develops, pain worsens, or develop fever or significant other systemic symptoms  Venous insufficiency Likely cause of swelling noted in legs.  Has vericose veins and no appreciated edema.  Low concern for DVT given bilateral, and no recent long flights or trip, and patient frequently up and walking.  Less likely liver or kidney issue given previous normal labs 1 month ago and no signs of fluid overload or ascites.  Less likely side effect of amlodipine as low dose of 5 mg and patient has been taking for several months without issue.  Do not feel further work-up is needed at this time. - Recommended compression stockings and see if improves   Delora Fuel, MD [place]

## 2021-01-28 ENCOUNTER — Other Ambulatory Visit: Payer: Self-pay

## 2021-01-28 ENCOUNTER — Ambulatory Visit (INDEPENDENT_AMBULATORY_CARE_PROVIDER_SITE_OTHER): Payer: Medicare Other | Admitting: Family Medicine

## 2021-01-28 ENCOUNTER — Encounter: Payer: Self-pay | Admitting: Family Medicine

## 2021-01-28 VITALS — BP 136/80 | HR 78 | Ht 60.0 in | Wt 213.6 lb

## 2021-01-28 DIAGNOSIS — F330 Major depressive disorder, recurrent, mild: Secondary | ICD-10-CM

## 2021-01-28 DIAGNOSIS — I10 Essential (primary) hypertension: Secondary | ICD-10-CM | POA: Diagnosis not present

## 2021-01-28 NOTE — Assessment & Plan Note (Signed)
-  PHQ-9 score of 9 with 0 for question 9. -patient not interested in therapy at this time -encouraged regular meals and hydration -no pharmacotherapy at this time -reassurance provided

## 2021-01-28 NOTE — Assessment & Plan Note (Signed)
-  BP 136/80 in the office -continue current regimen of half tablet of amlodipine/valsartan -diet and exercise counseling -encouraged to wear compression socks and elevate legs to prevent LE edema -f/u in 1 month for BP recheck, consider changing medications if BP not within appropriate range, consider BMP at this time

## 2021-01-28 NOTE — Progress Notes (Signed)
    SUBJECTIVE:   CHIEF COMPLAINT / HPI:   Patient presents for a follow up.  PERTINENT  PMH / PSH:   Hypertension Patient current HTN regimen only amlodipine/valsartan combo (half tablet). She was previously taking the entire tablet but then began to experience nausea so she started taking 1/2 tablet and has noticed a significant imporvement in both her BP and nausea. Denies taking HCTZ, although she was compliant on this last visit. Denies chest pain, endorses occasional dyspnea but has improved.   Depression  Reports that she has been experiencing less episodes of feeling down and depressed. Daughter-in-law present and endorses that she has noted a decreased energy level. She sometimes endorses palpitations and worries that something bad will happen, often worries about her son who is currently in jail. Denies suicidal thoughts. Has been only eating one meal a day only because she "doesn't want to get fat." Information on Spanish speaking therapists/psychaitrists provided to patient at previous visit.  OBJECTIVE:   BP 136/80   Pulse 78   Ht 5' (1.524 m)   Wt 213 lb 9.6 oz (96.9 kg)   SpO2 99%   BMI 41.72 kg/m   General: Patient well-appearing, in no acute distress. CV: RRR, no murmurs or gallops auscultated  Resp: CTAB, normal WOB on room air, no rales or rhonchi noted Abdomen: soft, nontender, presence of bowel sounds Ext: 1+ pitting edema noted bilaterally, radial pulses present bilaterally Neuro: normal gait without assistance or limitation Psych: mood appropriate denies active or passive SI, no intent or plan  ASSESSMENT/PLAN:   Essential hypertension -BP 136/80 in the office -continue current regimen of half tablet of amlodipine/valsartan -diet and exercise counseling -encouraged to wear compression socks and elevate legs to prevent LE edema -f/u in 1 month for BP recheck, consider changing medications if BP not within appropriate range, consider BMP at this  time  DEPRESSION -PHQ-9 score of 9 with 0 for question 9. -patient not interested in therapy at this time -encouraged regular meals and hydration -no pharmacotherapy at this time -reassurance provided       Donney Dice, [place]

## 2021-01-28 NOTE — Patient Instructions (Signed)
It was great seeing you today!  Today we discussed your blood pressure and mood. Please go on a walk daily for 30 minutes. Eat plenty of fruits and vegetables, please make sure to eat 3 meals daily. Wearing compression socks and elevating your legs will help reduce swelling. If you change your mind and want to see a therapist, go to [email or web address] and there have Spanish speaking providers available as well.   Please follow up in 1 month for your next scheduled appointment, if anything arises between now and then, please don't hesitate to contact our office.   Thank you for allowing [place] to be a part of your medical care!  Thank you, Dr. Larae Grooms

## 2021-02-10 ENCOUNTER — Ambulatory Visit: Payer: Medicare Other | Admitting: Family Medicine

## 2021-04-20 ENCOUNTER — Ambulatory Visit (INDEPENDENT_AMBULATORY_CARE_PROVIDER_SITE_OTHER): Payer: Medicare Other | Admitting: Family Medicine

## 2021-04-20 DIAGNOSIS — Z5329 Procedure and treatment not carried out because of patient's decision for other reasons: Secondary | ICD-10-CM

## 2021-07-08 ENCOUNTER — Other Ambulatory Visit: Payer: Self-pay | Admitting: Family Medicine

## 2021-07-08 DIAGNOSIS — I10 Essential (primary) hypertension: Secondary | ICD-10-CM

## 2021-09-23 ENCOUNTER — Other Ambulatory Visit: Payer: Self-pay | Admitting: *Deleted

## 2021-09-23 ENCOUNTER — Other Ambulatory Visit: Payer: Self-pay | Admitting: Family Medicine

## 2021-09-23 DIAGNOSIS — I10 Essential (primary) hypertension: Secondary | ICD-10-CM

## 2021-09-23 MED ORDER — AMLODIPINE BESYLATE-VALSARTAN 5-160 MG PO TABS: 1.0000 | ORAL_TABLET | Freq: Every day | ORAL | 0 refills | Status: DC

## 2021-10-04 ENCOUNTER — Ambulatory Visit: Payer: Medicare Other | Admitting: Family Medicine

## 2021-10-12 ENCOUNTER — Telehealth: Payer: Self-pay

## 2021-10-12 NOTE — Telephone Encounter (Signed)
Received fax from pharmacy that Amlodipine-Valsartan 5-160 mg is not covered by insurance.   Please see the below insurance preferred alternatives.   Health Plan's Preferred Products AMLODIPINE BESYLATE [id number] LOSARTAN POTASSIUM [id number] VALSARTAN [id number] OLMESARTAN MEDOXOMIL [id number]   Please advise if any of the alternatives are appropriate. If not, please include detailed reasoning for non preferred medication.   Talbot Grumbling, RN

## 2021-10-14 ENCOUNTER — Other Ambulatory Visit: Payer: Self-pay | Admitting: Family Medicine

## 2021-10-14 DIAGNOSIS — I10 Essential (primary) hypertension: Secondary | ICD-10-CM

## 2021-10-14 MED ORDER — AMLODIPINE BESYLATE 10 MG PO TABS: 10.0000 mg | ORAL_TABLET | Freq: Every day | ORAL | 0 refills | Status: DC

## 2021-10-26 NOTE — Telephone Encounter (Signed)
Attempted to reach patient with interpreter Dianah Field [phone]. Daughter in law answered and message below was left with her. Salvatore Marvel, CMA

## 2022-04-03 ENCOUNTER — Ambulatory Visit (INDEPENDENT_AMBULATORY_CARE_PROVIDER_SITE_OTHER): Payer: Medicare Other | Admitting: Family Medicine

## 2022-04-03 VITALS — BP 140/68 | HR 78 | Wt 201.0 lb

## 2022-04-03 DIAGNOSIS — Z1231 Encounter for screening mammogram for malignant neoplasm of breast: Secondary | ICD-10-CM

## 2022-04-03 DIAGNOSIS — I10 Essential (primary) hypertension: Secondary | ICD-10-CM | POA: Diagnosis not present

## 2022-04-03 DIAGNOSIS — E7800 Pure hypercholesterolemia, unspecified: Secondary | ICD-10-CM | POA: Diagnosis not present

## 2022-04-03 DIAGNOSIS — Z1211 Encounter for screening for malignant neoplasm of colon: Secondary | ICD-10-CM | POA: Diagnosis not present

## 2022-04-03 MED ORDER — AMLODIPINE BESYLATE-VALSARTAN 5-160 MG PO TABS: 1.0000 | ORAL_TABLET | Freq: Every day | ORAL | 1 refills | Status: DC

## 2022-04-03 MED ORDER — ATORVASTATIN CALCIUM 10 MG PO TABS: 10.0000 mg | ORAL_TABLET | Freq: Every day | ORAL | 3 refills | Status: DC

## 2022-04-03 NOTE — Patient Instructions (Addendum)
?  Fue genial verte hoy! ? ?Hoy hablamos sobre su presi?n arterial, contin?e tomando amlodipino-valsart?n una vez al d?a. Tambi?n tome la atorvastatina (Lipitor) una vez al d?a para su colesterol. Hoy nos haremos un an?lisis de [place], te avisar? de cualquier resultado anormal. ? ?No tome ning?n otro medicamento sin que lo vean para una evaluaci?n adecuada para que le puedan recetar el medicamento apropiado. ? ?POR [place] A SU PR?XIMA VISITA. ? ?Como discutimos la importancia de una mamograf?a y Ardelia Mems colonoscopia para la detecci?n del c?ncer de colon. Haga una cita para hacerse la mamograf?a lo antes posible. Te he derivado al gastroenter?logo para que te haga la colonoscopia. Si no tiene noticias de ellos dentro de 2 semanas, llame a nuestra cl?nica para ayudarlo con la programaci?n. ? ? ?Haga un seguimiento en su pr?xima cita programada en 3 semanas, si surge algo entre ahora y [place], no dude en comunicarse con nuestra oficina. ? ? ??Gracias por permitirnos ser parte de su atenci?n m?dica! ? ?Gracias, ?Dra. Teneisha Gignac ? ?It was great seeing you today! ? ?Today we discussed your blood pressure, please continue to take the amlodipine-valsartan once daily. Also please take the atorvastatin (Lipitor) once daily for your cholesterol. Today we will get blood work, I will let you know of any abnormal results.  ? ?Please do not take any other medications without being seen for a proper evaluation so that you can be prescribed the appropriate medication.  ? ?PLEASE BRING ALL YOUR MEDICATIONS TO YOUR NEXT VISIT.  ? ?As we discussed the importance of a mammogram and colonoscopy for colon cancer screening. Please make an appointment to get your mammogram at your earliest convenience. I have referred you to the gastroenterologist to do your colonoscopy. If you do not hear from them within 2 weeks then please call our clinic to assist with scheduling.  ? ? ?Please follow up at your next scheduled  appointment in 3 weeks, if anything arises between now and then, please don't hesitate to contact our office. ? ? ?Thank you for allowing [place] to be a part of your medical care! ? ?Thank you, ?Dr. Larae Grooms  ?

## 2022-04-03 NOTE — Assessment & Plan Note (Signed)
-  awaiting lipid panel today ?-continue atorvastatin daily  ?

## 2022-04-03 NOTE — Assessment & Plan Note (Signed)
-  PHQ-9 score of 7 with 1 for question 9 reviewed and extensively discussed.  ?-coping techniques encouraged including to continue to utilize support system ?-reassurance provided, reassuringly not endorsing SI or HI  ?

## 2022-04-03 NOTE — Assessment & Plan Note (Addendum)
-  BP 140/68, at goal ?-continue amlodipine-valsartan daily ?-awaiting BMP ?-follow up in 3 weeks ?

## 2022-04-03 NOTE — Progress Notes (Signed)
? ? ?  SUBJECTIVE:  ? ?CHIEF COMPLAINT / HPI:  ? ?Patient presents for blood pressure check up, she has not been able to maintain routine follow up as she has been in [place] for the past 6 months. She presents with a church friend. She denies chest pain. She takes amlodipine-valsartan daily for her blood pressure, also reports taking atorvastatin. She is not taking her HCTZ or other medications, she got these from [place] and often takes them because they are cheap. Reports that her mood is overall good, things are good at home. Her son is is jail but is content with being able to talk to him daily. Sister recently passed away. Sometimes she gets anxious regarding her son and this can cause her to briefly get short of breath which is why it occurs both at rest and with activity intermittently. When asked, she denies thoughts of harming herself or anyone else. She denies having a current plan in place or intent. She lives with her daughter-in-law and is happy with her living situation. Support system includes her spouse and close family who lives here as well.  ? ?OBJECTIVE:  ? ?BP 140/68   Pulse 78   Wt 201 lb (91.2 kg)   SpO2 98%   BMI 39.26 kg/m?   ?General: Patient well-appearing, in no acute distress. ?HEENT: non-tender thyroid ?CV: RRR, no murmurs or gallops auscultated ?Resp: CTAB, no wheezing, rales or rhonchi noted ?GI: soft, nontender, nondistended, presence of bowel sounds ?Ext: no LE edema noted bilaterally ?Psych: mood appropriate, pleasant, denies SI or HI, denies plan or intent ? ?ASSESSMENT/PLAN:  ? ?Essential hypertension ?-BP 140/68, at goal ?-continue amlodipine-valsartan daily ?-awaiting BMP ?-follow up in 3 weeks ? ?Hypercholesteremia ?-awaiting lipid panel today ?-continue atorvastatin daily  ? ?DEPRESSION ?-PHQ-9 score of 7 with 1 for question 9 reviewed and extensively discussed.  ?-coping techniques encouraged including to continue to utilize support system ?-reassurance provided,  reassuringly not endorsing SI or HI  ? ?Health maintenance  ?-Extensively discussed importance of cancer screening testing. Mammogram ordered and information provided to schedule an appointment at earliest convenience. GI referral placed for colonoscopy screening.  ?-Med rec reviewed and updated appropriately.  ?-Discussed importance of not taking any medications unless prescribed by a provider who evaluates her and appropriately prescribes it. Also instructed and encouraged to maintain routine follow up.  ? ?Video Spanish interpretation Mervin Hack [id number]) utilized throughout the entirety of this encounter.  ? ?Donney Dice, DO ?[place]  ?

## 2022-04-04 ENCOUNTER — Encounter (HOSPITAL_COMMUNITY): Payer: Self-pay | Admitting: Family Medicine

## 2022-04-04 LAB — LIPID PANEL
Chol/HDL Ratio: 3.5 ratio (ref 0.0–4.4)
Cholesterol, Total: 194 mg/dL (ref 100–199)
HDL: 55 mg/dL (ref >39)
LDL Chol Calc (NIH): 120 mg/dL — ABNORMAL HIGH (ref 0–99)
Triglycerides: 104 mg/dL (ref 0–149)
VLDL Cholesterol Cal: 19 mg/dL (ref 5–40)

## 2022-04-04 LAB — BASIC METABOLIC PANEL
BUN/Creatinine Ratio: 32 — ABNORMAL HIGH (ref 12–28)
BUN: 23 mg/dL (ref 8–27)
CO2: 24 mmol/L (ref 20–29)
Calcium: 8.2 mg/dL — ABNORMAL LOW (ref 8.7–10.3)
Chloride: 106 mmol/L (ref 96–106)
Creatinine, Ser: 0.71 mg/dL (ref 0.57–1.00)
Glucose: 99 mg/dL (ref 70–99)
Potassium: 3.8 mmol/L (ref 3.5–5.2)
Sodium: 144 mmol/L (ref 134–144)
eGFR: 92 mL/min/{1.73_m2} (ref >59)

## 2022-04-10 DIAGNOSIS — R928 Other abnormal and inconclusive findings on diagnostic imaging of breast: Secondary | ICD-10-CM | POA: Diagnosis not present

## 2022-04-10 DIAGNOSIS — R922 Inconclusive mammogram: Secondary | ICD-10-CM | POA: Diagnosis not present

## 2022-04-10 DIAGNOSIS — N6489 Other specified disorders of breast: Secondary | ICD-10-CM | POA: Diagnosis not present

## 2022-04-13 ENCOUNTER — Encounter: Payer: Self-pay | Admitting: Family Medicine

## 2022-04-17 ENCOUNTER — Ambulatory Visit (INDEPENDENT_AMBULATORY_CARE_PROVIDER_SITE_OTHER): Payer: Medicare Other | Admitting: Family Medicine

## 2022-04-17 ENCOUNTER — Encounter: Payer: Self-pay | Admitting: Family Medicine

## 2022-04-17 DIAGNOSIS — M5432 Sciatica, left side: Secondary | ICD-10-CM

## 2022-04-17 DIAGNOSIS — I10 Essential (primary) hypertension: Secondary | ICD-10-CM

## 2022-04-17 DIAGNOSIS — M5431 Sciatica, right side: Secondary | ICD-10-CM | POA: Insufficient documentation

## 2022-04-17 NOTE — Assessment & Plan Note (Signed)
-  handout with stretching exercises provided ?-compression socks  ?-follow up in 2-3 weeks, consider PT referral at this time ?-patient believes her veins are also playing a role as she may have varicose veins, discussed with patient and daughter-in-law that we can consider referral to vein specialist as well at next visit ?

## 2022-04-17 NOTE — Progress Notes (Signed)
? ? ?  SUBJECTIVE:  ? ?CHIEF COMPLAINT / HPI:  ? ?Patient presents for a blood pressure check up. Complaint on amlodipine-valsartan 5-160 mg daily. Daughter-in-law also present today. Denies chest pain and dyspnea.  ? ?Reports right pain that starts at her back and goes down her leg. It has been ongoing for the past 3 months. Reports that this pain sometimes interferes with her daily activities although she is still able to stay active and works a lot around the house. Denies any recent associated trauma or injury. Aggravating factors include walking. Relieving factors include tylenol. Sometimes feels numbness along the upper portion of her leg. Denies groin paresthesia or personal history of cancer.  ? ?OBJECTIVE:  ? ?BP (!) 141/61   Pulse 73   Ht 5' (1.524 m)   Wt 203 lb 3.2 oz (92.2 kg)   SpO2 100%   BMI 39.68 kg/m?   ?General: Patient well-appearing, in no acute distress. ?CV: RRR, no murmurs or gallops auscultated ?Resp: CTAB, no wheezing, rales or rhonchi noted ?MSK: no LE edema noted bilaterally, full ROM along knee joints bilaterally, positive straight leg on the left, distal pulses strong and equal bilaterally ?Neuro: gross sensation intact, 5/5 LE strength bilaterally  ?Psych: mood appropriate ? ?ASSESSMENT/PLAN:  ? ?Essential hypertension ?-BP at goal, continue amlodipine-valsartan ?-discussed recent blood work with patient, K and renal function within normal limits ? ?Sciatica, left side ?-handout with stretching exercises provided ?-compression socks  ?-follow up in 2-3 weeks, consider PT referral at this time ?-patient believes her veins are also playing a role as she may have varicose veins, discussed with patient and daughter-in-law that we can consider referral to vein specialist as well at next visit ? ?-PHQ-9 score of 3 with negative question 9 reviewed.  ? ?Patient prefers to use daughter-in-law for Spanish interpretation, politely declines other interpretation.  ? ?Donney Dice, DO ?[facility]  ?

## 2022-04-17 NOTE — Assessment & Plan Note (Signed)
-  BP at goal, continue amlodipine-valsartan ?-discussed recent blood work with patient, K and renal function within normal limits ?

## 2022-04-17 NOTE — Patient Instructions (Addendum)
?  Fue genial verte hoy! ? ?Hoy hablamos de su dolor en la pierna, creo que esto se debe en parte al dolor del nervio ci?tico. Realice los ejercicios adjuntos al [employer] veces al d?a para Education officer, community. Tambi?n puede tomar Tylenol para el dolor. ? ?Su presi?n arterial era buena, contin?e con el medicamento de amlodipina y valsart?n. Adem?s, contin?e tomando atorvastatina diariamente para su colesterol. ? ?Haga un seguimiento en su pr?xima cita programada en 2 a 4 semanas, si surge algo entre ahora y [place], no dude en comunicarse con nuestra oficina. ? ? ??Gracias por permitirnos ser parte de su atenci?n m?dica! ? ?Gracias, ?D[facility] [place] ? ? ?It was great seeing you today! ? ?Today we discussed your leg pain, I think this is partially due to sciatic nerve pain. Please do the attached exercises at least twice a day to help your pain. You may also take tylenol for the pain. ? ?Your blood pressure was good, please continue the amlodipine-valsartan medication. Also please continue to take atorvastatin daily for your cholesterol.  ? ?Please follow up at your next scheduled appointment in 2-4 weeks, if anything arises between now and then, please don't hesitate to contact our office. ? ? ?Thank you for allowing [place] to be a part of your medical care! ? ?Thank you, ?Dr. Larae Grooms  ?

## 2022-04-20 ENCOUNTER — Encounter: Payer: Self-pay | Admitting: Family Medicine

## 2022-05-05 ENCOUNTER — Ambulatory Visit (INDEPENDENT_AMBULATORY_CARE_PROVIDER_SITE_OTHER): Payer: Medicare Other | Admitting: Family Medicine

## 2022-05-05 ENCOUNTER — Encounter: Payer: Self-pay | Admitting: Family Medicine

## 2022-05-05 VITALS — BP 152/66 | HR 80 | Ht 60.0 in | Wt 204.0 lb

## 2022-05-05 DIAGNOSIS — I10 Essential (primary) hypertension: Secondary | ICD-10-CM

## 2022-05-05 DIAGNOSIS — M5431 Sciatica, right side: Secondary | ICD-10-CM | POA: Diagnosis not present

## 2022-05-05 NOTE — Assessment & Plan Note (Addendum)
-  BP 161/71, on repeat 152/66. Not appropriately controlled.  -increased to whole tablet daily so instructed to take 5 mg daily instead of 2.5 mg -encouraged to get BP cuff and monitor BP at least twice daily, bring this record to next visit -follow up in 1 week for BP recheck to ensure hypertension well-controlled

## 2022-05-05 NOTE — Progress Notes (Signed)
Left ABI 1.19 Right ABI 1.07 HR 77 SYS 167 DIA 92

## 2022-05-05 NOTE — Assessment & Plan Note (Addendum)
-  reassuringly no red flag symptoms and symptoms improve with stretching exercises -mild edema likely secondary to being on feet for long periods of time during the day, low concern for DVT or new onset heart failure. No evidence of skin changes to consider cellulitis. Likely MSK etiology.  -likely sciatic pain but also may worsen with varicose veins -continue stretching exercises and tylenol as appropriate -PT referral placed  -given information on vein specialist to that patient's daughter can contact -follow up in 1 month if no improvement or worsening

## 2022-05-05 NOTE — Progress Notes (Deleted)
[facility] #[id number] Video Spanish interpretation

## 2022-05-05 NOTE — Patient Instructions (Addendum)
It was great seeing you today!  Today we discussed your blood pressure and leg pain. Your blood pressure was elevated, please take a whole tablet of amlodipine-valsartan. Record your blood pressures at least twice daily and bring this record into your next visit.   For your leg pain, this is mostly due to sciatica. Please keep doing the stretching exercises. I have placed a referral to physical therapy, you should hear from them within 1-2 weeks. If you do not hear from them, then please contact our office with assistance with scheduling. Please make sure to elevate your legs as well. It may be helpful for you to see the varicose vein specialist. The information is below:   [facility] [place] [address], [place], [place] [address] [phone]  Please follow up at your next scheduled appointment in 1 week, if anything arises between now and then, please don't hesitate to contact our office.   Thank you for allowing [place] to be a part of your medical care!  Thank you, Dr. Larae Grooms

## 2022-05-05 NOTE — Progress Notes (Signed)
  Date of Visit: 05/05/2022   SUBJECTIVE:   HPI:  Brenda Wyatt presents today for follow-up of sciatica and hypertension.   HTN: she is only taking half of her amlodipine-valsartan pill daily and BP today was 152/66 on recheck. She does not have a way to check home blood pressure. She denies any chest pain. Denies any worsening shortness of breath, vision  changes or focal weakness..   Sciatica: she describes right sided back pain that radiates down to her lateral thigh. It hurts most when walking but is also painful at rest. She manages the pain with tylenol which relieves her pain when she takes it. She tried using the compression socks as recommended but stated that they made her legs swell more and did not decrease her pain, so she stopped using them. The pain is not present on her left side. She denies urinary retention and saddle anesthesia.    Brenda Wyatt Video Spanish interpretation present for entirety of visit.  OBJECTIVE:   BP (!) 152/66   Pulse 80   Ht 5' (1.524 m)   Wt 204 lb (92.5 kg)   SpO2 98%   BMI 39.84 kg/m  Gen: Well-appearing, slight difficulty with ambulation as not putting full weight on right leg HEENT: NCAT CVS exam: S1 and S2 normal, no murmurs noted. Lungs:  Lungs are clear to auscultation bilaterally. Neuro: alert, non-pressured speech, 2+ pedal pulses bilaterally, varicose veins present. Strength 5/5 in lower extremities bilaterally. Straight leg raise test positive on right side. Ext: mild trace pitting edema noted bilaterally distal pulses strong and equal bilaterally, no calf tenderness  Psych: mood appropriate, very pleasant   ASSESSMENT/PLAN:   Health maintenance:  -Encourage to receive colonoscopy at next visit  Essential hypertension -BP 161/71, on repeat 152/66. Not appropriately controlled.  -increased to whole tablet daily so instructed to take 5 mg daily instead of 2.5 mg -encouraged to get BP cuff and monitor BP at least twice daily,  bring this record to next visit -follow up in 1 week for BP recheck to ensure hypertension well-controlled    Sciatica of right side -reassuringly no red flag symptoms and symptoms improve with stretching exercises -mild edema likely secondary to being on feet for long periods of time during the day, low concern for DVT or new onset heart failure. No evidence of skin changes to consider cellulitis. Likely MSK etiology.  -likely sciatic pain but also may worsen with varicose veins -continue stretching exercises and tylenol as appropriate -PT referral placed  -given information on vein specialist to that patient's daughter can contact -follow up in 1 month if no improvement or worsening     FOLLOW UP: Follow up in 1 week for BP recheck  Annia Belt, [place]  I was personally present and performed or re-performed the history, physical exam and medical decision making activities of this service and have verified that the service and findings are accurately documented in the student's note. My edits are noted within the note.   Donney Dice, DO                  05/05/2022, 3:29 PM  PGY-2, [facility]

## 2022-05-15 ENCOUNTER — Ambulatory Visit: Payer: Medicare Other | Admitting: Family Medicine

## 2022-06-13 ENCOUNTER — Encounter: Payer: Medicare Other | Admitting: Internal Medicine

## 2022-07-25 NOTE — Therapy (Signed)
OUTPATIENT PHYSICAL THERAPY THORACOLUMBAR/ KNEE EVALUATION   Patient Name: Brenda Wyatt MRN: [id number] DOB:Jun 20, 1951, 71 y.o., female Today's Date: 07/26/2022   PT End of Session - 07/26/22 1757     Visit Number 1    Number of Visits 9    Date for PT Re-Evaluation 09/27/22    Authorization Type MCR    Authorization Time Period FOTO v6, v10, kx mod v15    Progress Note Due on Visit 10    PT Start Time 1700    PT Stop Time 1800    PT Time Calculation (min) 60 min    Activity Tolerance Patient tolerated treatment well    Behavior During Therapy WFL for tasks assessed/performed             Past Medical History:  Diagnosis Date   Anxiety    Hypertension    History reviewed. No pertinent surgical history. Patient Active Problem List   Diagnosis Date Noted   Sciatica of right side 04/17/2022   GERD without esophagitis 12/23/2020   Anxiety 12/23/2020   Hypercholesteremia 12/23/2020   Lower extremity edema 07/17/2014   Polyuria 07/17/2014   Acanthosis nigricans 07/17/2014   ENDOGENOUS OBESITY 05/31/2009   CONTACT DERMATITIS&OTHER ECZEMA DUE UNSPEC CAUSE 05/31/2009   DEPRESSION 04/26/2009   Essential hypertension 04/26/2009   GASTRITIS 04/26/2009   CARDIAC MURMUR 04/26/2009   COUGH 04/26/2009    PCP: Donney Dice, DO  REFERRING PROVIDER:   Zenia Resides, MD    REFERRING DIAG: M54.31 (ICD-10-CM) - Sciatica, right side  Rationale for Evaluation and Treatment Rehabilitation  THERAPY DIAG:  Chronic pain of right knee  Other low back pain  Muscle weakness (generalized)  Difficulty in walking, not elsewhere classified  Pain in right hip  Localized edema  ONSET DATE: four months ago  SUBJECTIVE:                                                                                                                                                                                           SUBJECTIVE STATEMENT: Pt reports primary c/o Rt knee pain  of insidious onset lasting about four months. Pt denies any popping/ clicking. However, she reports occasional locking after prolonged periods of sitting, as well as occasional buckling with walking. Pt also reports upper thigh N/T lasting about six months, as well as Rt hip pain lasting about a year. Pt also endorses Rt>Lt LBP. Pt's primary pain complaint at this time is her Rt knee. Current pain is 10/10. Best pain is 7/10. Aggravating factors include standing > 10 minutes, walking >1 hour, sitting >2 hours. Easing factors include Tylenol and ice. Pt  described pain as gnawing. Pt reports about a 20-lb weight loss over the past year. She denies any bowel/ bladder changes, unrelenting night pain, and saddle anesthesia.  PERTINENT HISTORY:  Anxiety, HTN, depression  PAIN:  Are you having pain? Yes: NPRS scale: 10/10 Pain location: Rt knee, Rt low back, Rt hip Pain description: gnawing Aggravating factors: standing > 10 minutes, walking >1 hour, sitting >2 hours Relieving factors: Tylenol and ice   PRECAUTIONS: None  WEIGHT BEARING RESTRICTIONS No  FALLS:  Has patient fallen in last 6 months? No  LIVING ENVIRONMENT: Lives with: lives with their family Lives in: House/apartment Stairs: Yes: External: 3-4 steps; on right going up, on left going up, and can reach both Has following equipment at home: None  OCCUPATION: Unemployed  PLOF: Independent  PATIENT GOALS exercise, home chores  Screening for Suicide  Answer the following questions with Yes or No and place an "x" beside the action taken.  1. Over the past two weeks, have you felt down, depressed, or hopeless?   Yes  2. Within the past two weeks, have you felt little interest or pleasure in life?  Yes  If YES to either #1 or #2, then ask #3  3. Have you had thoughts that life is not worth living or that you might be       better off dead?   Yes  If answer is NO and suspicion is low, then end   4. Over this past week,  have you had any thoughts about hurting or even killing yourself?  No  If NO, then end. Patient in no immediate danger   5. If so, do you believe that you intend to or will harm yourself?       If NO, then end. Patient in no immediate danger   6.  Do you have a plan as to how you would hurt yourself?     7.  Over this past week, have you actually done anything to hurt yourself?    IF YES answers to either #4, #5, #6 or #7, then patient is AT RISK for suicide   Actions Taken  ____  Screening negative; no further action required  ____  Screening positive; no immediate danger and patient already in treatment with a  mental health provider. Advise patient to speak to their mental health provider.  __X__  Screening positive; no immediate danger. Patient advised to contact a mental  health provider for further assessment.   ____  Screening positive; in immediate danger as patient states intention of killing self,  has plan and a sense of imminence. Do not leave alone. Seek permission from  patient to contact a family member to inform them. Direct patient to go to ED.   OBJECTIVE:   DIAGNOSTIC FINDINGS:  None available  PATIENT SURVEYS:  FOTO Lumber: 65%, predicted 73% in 9 visits  Knee: 54%, predicted 67% in 12 visits  SCREENING FOR RED FLAGS: Bowel or bladder incontinence: No Cauda equina syndrome: No   COGNITION:  Overall cognitive status: Within functional limits for tasks assessed     SENSATION: Not tested  MUSCLE LENGTH: Hamstrings: WNL BIL Thomas test: Not assessed  POSTURE: rounded shoulders, forward head, flexed trunk , and weight shift left  PALPATION: TTP about Rt medial joint line  LUMBAR ROM:   Active  A/PROM  eval  Flexion WNL  Extension WNL, "feels good"  Right lateral flexion WNL, Rt knee pain  Left lateral flexion WNL  Right  rotation WNL  Left rotation WNL   (Blank rows = not tested)   LOWER EXTREMITY MMT:    MMT Right eval  Left eval  Hip flexion 4/5p! 5/5  Hip extension 4/5 4/5  Hip abduction 3+/5 4+/5  Knee flexion 4/5p! 5/5  Knee extension 4/5p! 5/5  Ankle dorsiflexion 5/5 5/5  Ankle plantarflexion 5/5 5/5   (Blank rows = not tested)  LOWER EXTREMITY AROM:    MMT Right eval Left eval  Knee flexion 108/110p! 125/128  Knee extension -3/0p! -2/0   (Blank rows = not tested)  SPECIAL TESTS:  Slump: (-) on Rt SLR: (-) on Rt Lateral pull sign: (+) on Rt Patellar apprehension: (+) on Rt Patellar compression: (+) on Rt McMurray's: (+) on Rt Apley's: (+) on Rt  FUNCTIONAL TESTS:  Squat: 50%, pain and audible clicking in Rt knee Plank: Unable 5xSTS: 18 seconds, increased Rt knee pain   TODAY'S TREATMENT  07/26/2022: Demonstrated and issued HEP   PATIENT EDUCATION:  Education details: Pt educated on probable underlying pathophysiology, POC, prognosis, FOTO, and HEP Person educated: Patient Education method: Explanation, Demonstration, and Handouts Education comprehension: verbalized understanding and returned demonstration   HOME EXERCISE PROGRAM: Access Code: [email or web address] URL: [email or web address] Date: 07/26/2022 Prepared by: Vanessa Ector  Exercises - Seated Long Arc Quad  - 1 x daily - 7 x weekly - 3 sets - 10 reps - 5 segundos hold - Sidelying Hip Abduction  - 1 x daily - 7 x weekly - 2 sets - 10 reps - 3 segundos hold - Supine 90/90 Abdominal Bracing *with handhold resistance to thighs*  - 1 x daily - 7 x weekly - 3 sets - 30 segundos hold - Supine Bridge  - 1 x daily - 7 x weekly - 3 sets - 10 reps - 5 segundos hold  ASSESSMENT:  CLINICAL IMPRESSION: Patient is a 71 y.o. F who was seen today for physical therapy evaluation and treatment for subacute Rt knee, Rt hip, and Rt low back pain.  Upon assessment, her primary impairments include painful Rt knee flexion and extension PROM, limited global Rt knee AROM, weak Rt>Lt global hip MMT, weak and painful Rt knee MMT,  TTP about Rt medial knee joint line, painful and limited squat, and limited 5xSTS.  Ruling up Rt knee meniscal pathology due to joint line tenderness, audible clicking/ popping with squatting, report of knee locking, positive Apley's, positive McMurray's, and pain with Rt knee flexion and extension PROM with overpressure. Ruling down lumbar radiculopathy due to negative slump and SLR testing, along with no reproduced lumbar pain with movement. Cannot rule out PFPS due to positive cluster testing. Pt will benefit from skilled PT to address her primary impairments and return to her prior level of function with less limitation. If pt fails to make progress with conservative treatment, will consider imaging to rule in/ out meniscal tear.   OBJECTIVE IMPAIRMENTS Abnormal gait, decreased activity tolerance, decreased balance, decreased endurance, decreased mobility, difficulty walking, decreased ROM, decreased strength, hypomobility, increased edema, impaired flexibility, impaired sensation, improper body mechanics, postural dysfunction, and pain.   ACTIVITY LIMITATIONS bending, sitting, standing, squatting, stairs, transfers, and locomotion level  PARTICIPATION LIMITATIONS: cleaning, laundry, shopping, community activity, and yard work  PERSONAL FACTORS 3+ comorbidities: See medical hx  are also affecting patient's functional outcome.   REHAB POTENTIAL: Fair Due to multiple treatment sites and comorbidities  CLINICAL DECISION MAKING: Evolving/moderate complexity  EVALUATION COMPLEXITY: Moderate   GOALS: Goals reviewed with patient? Yes  SHORT  TERM GOALS: Target date: 08/22/2022  Pt will report understanding and adherence to initial HEP in order to promote independence in the management of primary impairments. Baseline: HEP provided at eval Goal status: INITIAL   LONG TERM GOALS: Target date: 09/19/2022  Pt will achieve a lumbar FOTO score of 73% and a knee FOTO score of 67%  in order to  demonstrate improved functional ability as it relates to her primary impairments. Baseline: Lumbar: 65%, knee: 54% Goal status: INITIAL  2.  Pt will achieve 5 full-depth squats with 0-4/10 pain in order to lift groceries from the floor with less limitation. Baseline: 50% depth with 10/10 pain Goal status: INITIAL  3.  Pt will achieve Rt knee flexion AROM of >120 degrees with 0-4/10 pain in order to achieve WNL gait mechanics. Baseline: 108 degrees with 10/10 pain Goal status: INITIAL  4.  Pt will report ability to stand >20 minutes with 0-4/10 pain in order to cook with less limitation. Baseline: >7/10 pain with >10 minutes of standing Goal status: INITIAL  5.  Pt will achieve a 5xSTS of <14 seconds in order to demonstrate safe transfers. Baseline: 18 seconds Goal status: INITIAL    PLAN: PT FREQUENCY: 1x/week  PT DURATION: 8 weeks  PLANNED INTERVENTIONS: Therapeutic exercises, Therapeutic activity, Neuromuscular re-education, Balance training, Gait training, Patient/Family education, Self Care, Joint mobilization, Stair training, Orthotic/Fit training, DME instructions, Aquatic Therapy, Dry Needling, Electrical stimulation, Spinal mobilization, Cryotherapy, Moist heat, Taping, Vasopneumatic device, Traction, Biofeedback, Ionotophoresis '4mg'$ /ml Dexamethasone, Manual therapy, and Re-evaluation.  PLAN FOR NEXT SESSION: Progress core/ hip/ knee strengthening to tolerance, knee ROM, assessment of Rt hip   Vanessa Rockland, PT, DPT 07/26/22 6:20 PM

## 2022-07-26 ENCOUNTER — Other Ambulatory Visit: Payer: Self-pay

## 2022-07-26 ENCOUNTER — Ambulatory Visit: Payer: Medicare Other | Attending: Family Medicine

## 2022-07-26 DIAGNOSIS — G8929 Other chronic pain: Secondary | ICD-10-CM | POA: Insufficient documentation

## 2022-07-26 DIAGNOSIS — R600 Localized edema: Secondary | ICD-10-CM | POA: Diagnosis not present

## 2022-07-26 DIAGNOSIS — R262 Difficulty in walking, not elsewhere classified: Secondary | ICD-10-CM | POA: Insufficient documentation

## 2022-07-26 DIAGNOSIS — M25561 Pain in right knee: Secondary | ICD-10-CM | POA: Diagnosis not present

## 2022-07-26 DIAGNOSIS — M25551 Pain in right hip: Secondary | ICD-10-CM | POA: Insufficient documentation

## 2022-07-26 DIAGNOSIS — M6281 Muscle weakness (generalized): Secondary | ICD-10-CM | POA: Insufficient documentation

## 2022-07-26 DIAGNOSIS — M5431 Sciatica, right side: Secondary | ICD-10-CM | POA: Insufficient documentation

## 2022-07-26 DIAGNOSIS — M5459 Other low back pain: Secondary | ICD-10-CM | POA: Diagnosis not present

## 2022-08-03 ENCOUNTER — Ambulatory Visit: Payer: Medicare Other

## 2022-08-03 DIAGNOSIS — R600 Localized edema: Secondary | ICD-10-CM

## 2022-08-03 DIAGNOSIS — M25561 Pain in right knee: Secondary | ICD-10-CM | POA: Diagnosis not present

## 2022-08-03 DIAGNOSIS — G8929 Other chronic pain: Secondary | ICD-10-CM | POA: Diagnosis not present

## 2022-08-03 DIAGNOSIS — R262 Difficulty in walking, not elsewhere classified: Secondary | ICD-10-CM | POA: Diagnosis not present

## 2022-08-03 DIAGNOSIS — M25551 Pain in right hip: Secondary | ICD-10-CM

## 2022-08-03 DIAGNOSIS — M6281 Muscle weakness (generalized): Secondary | ICD-10-CM | POA: Diagnosis not present

## 2022-08-03 DIAGNOSIS — M5459 Other low back pain: Secondary | ICD-10-CM

## 2022-08-03 NOTE — Therapy (Signed)
OUTPATIENT PHYSICAL THERAPY TREATMENT NOTE   Patient Name: Fawn Desrocher MRN: [id number] DOB:1951/01/02, 71 y.o., female Today's Date: 08/03/2022  PCP: Donney Dice, DO REFERRING PROVIDER: Zenia Resides, MD  END OF SESSION:   PT End of Session - 08/03/22 1653     Visit Number 2    Number of Visits 9    Date for PT Re-Evaluation 09/27/22    Authorization Type MCR    Authorization Time Period FOTO v6, v10, kx mod v15    Progress Note Due on Visit 10    PT Start Time 1655    PT Stop Time 1740    PT Time Calculation (min) 45 min    Activity Tolerance Patient tolerated treatment well    Behavior During Therapy WFL for tasks assessed/performed             Past Medical History:  Diagnosis Date   Anxiety    Hypertension    History reviewed. No pertinent surgical history. Patient Active Problem List   Diagnosis Date Noted   Sciatica of right side 04/17/2022   GERD without esophagitis 12/23/2020   Anxiety 12/23/2020   Hypercholesteremia 12/23/2020   Lower extremity edema 07/17/2014   Polyuria 07/17/2014   Acanthosis nigricans 07/17/2014   ENDOGENOUS OBESITY 05/31/2009   CONTACT DERMATITIS&OTHER ECZEMA DUE UNSPEC CAUSE 05/31/2009   DEPRESSION 04/26/2009   Essential hypertension 04/26/2009   GASTRITIS 04/26/2009   CARDIAC MURMUR 04/26/2009   COUGH 04/26/2009    REFERRING DIAG: M54.31 (ICD-10-CM) - Sciatica, right side  THERAPY DIAG:  Chronic pain of right knee  Other low back pain  Muscle weakness (generalized)  Difficulty in walking, not elsewhere classified  Pain in right hip  Localized edema  Rationale for Evaluation and Treatment Rehabilitation  PERTINENT HISTORY: Anxiety, HTN, depression  PRECAUTIONS: None  SUBJECTIVE: Patient states her R knee is hurting badly today.  PAIN:  Are you having pain? Yes: NPRS scale: 10/10 Pain location: Rt knee, Rt low back, Rt hip Pain description: gnawing Aggravating factors: standing > 10  minutes, walking >1 hour, sitting >2 hours Relieving factors: Tylenol and ice   OBJECTIVE: (objective measures completed at initial evaluation unless otherwise dated)   DIAGNOSTIC FINDINGS:  None available   PATIENT SURVEYS:  FOTO Lumber: 65%, predicted 73% in 9 visits           Knee: 54%, predicted 67% in 12 visits   SCREENING FOR RED FLAGS: Bowel or bladder incontinence: No Cauda equina syndrome: No     COGNITION:           Overall cognitive status: Within functional limits for tasks assessed                          SENSATION: Not tested   MUSCLE LENGTH: Hamstrings: WNL BIL Thomas test: Not assessed   POSTURE: rounded shoulders, forward head, flexed trunk , and weight shift left   PALPATION: TTP about Rt medial joint line   LUMBAR ROM:    Active  A/PROM  eval  Flexion WNL  Extension WNL, "feels good"  Right lateral flexion WNL, Rt knee pain  Left lateral flexion WNL  Right rotation WNL  Left rotation WNL   (Blank rows = not tested)     LOWER EXTREMITY MMT:     MMT Right eval Left eval  Hip flexion 4/5p! 5/5  Hip extension 4/5 4/5  Hip abduction 3+/5 4+/5  Knee flexion 4/5p! 5/5  Knee extension 4/5p!  5/5  Ankle dorsiflexion 5/5 5/5  Ankle plantarflexion 5/5 5/5   (Blank rows = not tested)   LOWER EXTREMITY AROM:     MMT Right eval Left eval  Knee flexion 108/110p! 125/128  Knee extension -3/0p! -2/0   (Blank rows = not tested)   SPECIAL TESTS:  Slump: (-) on Rt SLR: (-) on Rt Lateral pull sign: (+) on Rt Patellar apprehension: (+) on Rt Patellar compression: (+) on Rt McMurray's: (+) on Rt Apley's: (+) on Rt   FUNCTIONAL TESTS:  Squat: 50%, pain and audible clicking in Rt knee Plank: Unable 5xSTS: 18 seconds, increased Rt knee pain     TODAY'S TREATMENT  [facility] Adult PT Treatment:                                                DATE: 08/03/2022 Therapeutic Exercise: Nustep level 4 x 5 mins LE only Seated marching 2x10 BIL LAQ  2x10 BIL Seated hamstring curl GTB x10 BIL Supine clamshell GTB 2x10  SLR x10 BIL Bridges 2x10 Sidelying hip abduction x10 Lt STS 2x10  07/26/2022: Demonstrated and issued HEP     PATIENT EDUCATION:  Education details: Pt educated on probable underlying pathophysiology, POC, prognosis, FOTO, and HEP Person educated: Patient Education method: Explanation, Demonstration, and Handouts Education comprehension: verbalized understanding and returned demonstration     HOME EXERCISE PROGRAM: Access Code: [email or web address] URL: [email or web address] Date: 07/26/2022 Prepared by: Vanessa [place]   Exercises - Seated Long Arc Quad  - 1 x daily - 7 x weekly - 3 sets - 10 reps - 5 segundos hold - Sidelying Hip Abduction  - 1 x daily - 7 x weekly - 2 sets - 10 reps - 3 segundos hold - Supine 90/90 Abdominal Bracing *with handhold resistance to thighs*  - 1 x daily - 7 x weekly - 3 sets - 30 segundos hold - Supine Bridge  - 1 x daily - 7 x weekly - 3 sets - 10 reps - 5 segundos hold   ASSESSMENT:   CLINICAL IMPRESSION: Patient presents to PT with very high pain in her R knee, hip and lower back. She reports daily HEP compliance. She is somewhat limited by pain and weakness throughout session, though has good participation. Session today focused on proximal hip and LE strengthening. Patient continues to benefit from skilled PT services and should be progressed as able to improve functional independence.    OBJECTIVE IMPAIRMENTS Abnormal gait, decreased activity tolerance, decreased balance, decreased endurance, decreased mobility, difficulty walking, decreased ROM, decreased strength, hypomobility, increased edema, impaired flexibility, impaired sensation, improper body mechanics, postural dysfunction, and pain.    ACTIVITY LIMITATIONS bending, sitting, standing, squatting, stairs, transfers, and locomotion level   PARTICIPATION LIMITATIONS: cleaning, laundry, shopping, community  activity, and yard work   PERSONAL FACTORS 3+ comorbidities: See medical hx  are also affecting patient's functional outcome.    REHAB POTENTIAL: Fair Due to multiple treatment sites and comorbidities   CLINICAL DECISION MAKING: Evolving/moderate complexity   EVALUATION COMPLEXITY: Moderate     GOALS: Goals reviewed with patient? Yes   SHORT TERM GOALS: Target date: 08/22/2022   Pt will report understanding and adherence to initial HEP in order to promote independence in the management of primary impairments. Baseline: HEP provided at eval Goal status: INITIAL     LONG TERM GOALS: Target date: 09/19/2022  Pt will achieve a lumbar FOTO score of 73% and a knee FOTO score of 67%  in order to demonstrate improved functional ability as it relates to her primary impairments. Baseline: Lumbar: 65%, knee: 54% Goal status: INITIAL   2.  Pt will achieve 5 full-depth squats with 0-4/10 pain in order to lift groceries from the floor with less limitation. Baseline: 50% depth with 10/10 pain Goal status: INITIAL   3.  Pt will achieve Rt knee flexion AROM of >120 degrees with 0-4/10 pain in order to achieve WNL gait mechanics. Baseline: 108 degrees with 10/10 pain Goal status: INITIAL   4.  Pt will report ability to stand >20 minutes with 0-4/10 pain in order to cook with less limitation. Baseline: >7/10 pain with >10 minutes of standing Goal status: INITIAL   5.  Pt will achieve a 5xSTS of <14 seconds in order to demonstrate safe transfers. Baseline: 18 seconds Goal status: INITIAL       PLAN: PT FREQUENCY: 1x/week   PT DURATION: 8 weeks   PLANNED INTERVENTIONS: Therapeutic exercises, Therapeutic activity, Neuromuscular re-education, Balance training, Gait training, Patient/Family education, Self Care, Joint mobilization, Stair training, Orthotic/Fit training, DME instructions, Aquatic Therapy, Dry Needling, Electrical stimulation, Spinal mobilization, Cryotherapy, Moist heat,  Taping, Vasopneumatic device, Traction, Biofeedback, Ionotophoresis '4mg'$ /ml Dexamethasone, Manual therapy, and Re-evaluation.   PLAN FOR NEXT SESSION: Progress core/ hip/ knee strengthening to tolerance, knee ROM, assessment of Rt hip    [name], PTA 08/03/2022, 4:53 PM

## 2022-08-08 ENCOUNTER — Encounter: Payer: Self-pay | Admitting: Family Medicine

## 2022-08-08 ENCOUNTER — Ambulatory Visit (INDEPENDENT_AMBULATORY_CARE_PROVIDER_SITE_OTHER): Payer: Medicare Other | Admitting: Family Medicine

## 2022-08-08 VITALS — BP 126/62 | HR 75 | Ht 59.5 in | Wt 202.0 lb

## 2022-08-08 DIAGNOSIS — E7800 Pure hypercholesterolemia, unspecified: Secondary | ICD-10-CM | POA: Diagnosis not present

## 2022-08-08 DIAGNOSIS — Z0000 Encounter for general adult medical examination without abnormal findings: Secondary | ICD-10-CM

## 2022-08-08 DIAGNOSIS — I10 Essential (primary) hypertension: Secondary | ICD-10-CM | POA: Diagnosis not present

## 2022-08-08 DIAGNOSIS — Z1211 Encounter for screening for malignant neoplasm of colon: Secondary | ICD-10-CM | POA: Diagnosis not present

## 2022-08-08 MED ORDER — ATORVASTATIN CALCIUM 10 MG PO TABS: 10.0000 mg | ORAL_TABLET | Freq: Every day | ORAL | 3 refills | Status: DC

## 2022-08-08 NOTE — Assessment & Plan Note (Signed)
-  BP 126/62, at goal -continue current antihypertensive regimen, no changes made today -diet and exercise counseling -BMP up to date and appropriate

## 2022-08-08 NOTE — Assessment & Plan Note (Signed)
-   continue statin therapy. 

## 2022-08-08 NOTE — Progress Notes (Signed)
    SUBJECTIVE:   CHIEF COMPLAINT / HPI:   Patient presents for physical. She still has leg and hip pain from prior but has been going to physical therapy which has helped her be more active and improved the pain. Stretches at least 15 minutes a day. Family is doing well. She is accompanied by her daughter-in-law who is also serving as Veterinary surgeon. They decline other interpretor services. Has never smoked and does not drink alcohol. She went to urgent for leg swelling 2 months ago and she was initially on amlodipine-valsartan. They switched her to valsartan-HCTZ and since then not only has her BP been well-controlled but also improved leg swelling.   OBJECTIVE:   BP 126/62   Pulse 75   Ht 4' 11.5" (1.511 m)   Wt 202 lb (91.6 kg)   SpO2 98%   BMI 40.12 kg/m   General: Patient well-appearing, in no acute distress.  HEENT: PERRLA, normal buccal mucosa, non-tender thyroid, no evidence of cervical LAD CV: RRR, no murmurs or gallops auscultated Resp: CTAB, no wheezing, rales or rhonchi noted Abdomen: soft, nontender, nondistended, presence of bowel sounds Ext: no LE edema noted bilaterally Psych: mood appropriate, very pleasant   ASSESSMENT/PLAN:   Essential hypertension -BP 126/62, at goal -continue current antihypertensive regimen, no changes made today -diet and exercise counseling -BMP up to date and appropriate   Hypercholesteremia -continue statin therapy  Health maintenance -Lipid panel up to date, statin refills sent to desired pharmacy -Med rec reviewed and updated appropriately  -Discussed importance of colon cancer screening with colonoscopy being the gold standard for evaluation. Patient agreeable, GI referral placed.     Donney Dice, [place]

## 2022-08-08 NOTE — Patient Instructions (Addendum)
  Fue genial verte hoy!  Me alegro que ests bien! Le envi resurtidos de sus medicamentos para el colesterol; contine tomndolos diariamente. Su presin arterial se ve muy bien, hoy es 126/62. Contine tomando valsartn-hidroclorotiazida.  Es importante hacerse una prueba de deteccin de cncer de colon, por eso lo derivaron al gastroenterlogo para que me hiciera una colonoscopia. Debera recibir noticias suyas en aproximadamente 2 o 3 semanas para programar esto.  Haga un seguimiento en su prxima cita programada dentro de 1 ao; si surge algo entre ahora y [place], no dude en comunicarse con nuestra oficina.   Gracias por permitirnos ser parte de su atencin mdica!  Gracias, Dr. Larae Grooms  It was great seeing you today!  I am glad that you are doing well! I have sent refills on your cholesterol medication, please continue to take this daily. Your blood pressure looks great, it is 126/62 today. Please continue to take your valsartan-hydrochlorothiazide.   It is importance to have colon cancer screening, so I have placed a referral to the gastroenterologist to get a colonoscopy. You should hear from them in about 2-3 weeks to schedule this.   Please follow up at your next scheduled appointment in 1 year, if anything arises between now and then, please don't hesitate to contact our office.   Thank you for allowing Brenda Wyatt to be a part of your medical care!  Thank you, Dr. Larae Grooms

## 2022-08-10 ENCOUNTER — Ambulatory Visit: Payer: Medicare Other | Attending: Family Medicine

## 2022-08-10 DIAGNOSIS — M6281 Muscle weakness (generalized): Secondary | ICD-10-CM | POA: Insufficient documentation

## 2022-08-10 DIAGNOSIS — M25551 Pain in right hip: Secondary | ICD-10-CM | POA: Diagnosis not present

## 2022-08-10 DIAGNOSIS — M5459 Other low back pain: Secondary | ICD-10-CM | POA: Insufficient documentation

## 2022-08-10 DIAGNOSIS — M25561 Pain in right knee: Secondary | ICD-10-CM | POA: Insufficient documentation

## 2022-08-10 DIAGNOSIS — R600 Localized edema: Secondary | ICD-10-CM | POA: Insufficient documentation

## 2022-08-10 DIAGNOSIS — R262 Difficulty in walking, not elsewhere classified: Secondary | ICD-10-CM | POA: Diagnosis not present

## 2022-08-10 DIAGNOSIS — G8929 Other chronic pain: Secondary | ICD-10-CM | POA: Insufficient documentation

## 2022-08-10 NOTE — Therapy (Signed)
OUTPATIENT PHYSICAL THERAPY TREATMENT NOTE   Patient Name: Brenda Wyatt MRN: [id number] DOB:06-Oct-1951, 71 y.o., female Today's Date: 08/10/2022  PCP: Donney Dice, DO REFERRING PROVIDER: Zenia Resides, MD  END OF SESSION:   PT End of Session - 08/10/22 1831     Visit Number 3    Number of Visits 9    Date for PT Re-Evaluation 09/27/22    Authorization Time Period FOTO v6, v10, kx mod v15    Progress Note Due on Visit 10    PT Start Time 1830    PT Stop Time 1910    PT Time Calculation (min) 40 min    Activity Tolerance Patient tolerated treatment well;Patient limited by pain    Behavior During Therapy [facility] for tasks assessed/performed              Past Medical History:  Diagnosis Date   Anxiety    Hypertension    History reviewed. No pertinent surgical history. Patient Active Problem List   Diagnosis Date Noted   Sciatica of right side 04/17/2022   GERD without esophagitis 12/23/2020   Anxiety 12/23/2020   Hypercholesteremia 12/23/2020   Lower extremity edema 07/17/2014   Polyuria 07/17/2014   Acanthosis nigricans 07/17/2014   ENDOGENOUS OBESITY 05/31/2009   CONTACT DERMATITIS&OTHER ECZEMA DUE UNSPEC CAUSE 05/31/2009   DEPRESSION 04/26/2009   Essential hypertension 04/26/2009   GASTRITIS 04/26/2009   CARDIAC MURMUR 04/26/2009   COUGH 04/26/2009    REFERRING DIAG: M54.31 (ICD-10-CM) - Sciatica, right side  THERAPY DIAG:  Chronic pain of right knee  Other low back pain  Muscle weakness (generalized)  Difficulty in walking, not elsewhere classified  Pain in right hip  Localized edema  Rationale for Evaluation and Treatment Rehabilitation  PERTINENT HISTORY: Anxiety, HTN, depression  PRECAUTIONS: None  SUBJECTIVE: Pt reports continued 10/10 Rt knee pain, as well as Rt hip/ back pain with standing. Pt reports daily adherence to her HEP.  PAIN:  Are you having pain? Yes: NPRS scale: 10/10 Pain location: Rt knee, Rt low back, Rt  hip Pain description: gnawing Aggravating factors: standing > 10 minutes, walking >1 hour, sitting >2 hours Relieving factors: Tylenol and ice   OBJECTIVE: (objective measures completed at initial evaluation unless otherwise dated)   DIAGNOSTIC FINDINGS:  None available   PATIENT SURVEYS:  FOTO Lumber: 65%, predicted 73% in 9 visits           Knee: 54%, predicted 67% in 12 visits   SCREENING FOR RED FLAGS: Bowel or bladder incontinence: No Cauda equina syndrome: No     COGNITION:           Overall cognitive status: Within functional limits for tasks assessed                          SENSATION: Not tested   MUSCLE LENGTH: Hamstrings: WNL BIL Thomas test: Not assessed   POSTURE: rounded shoulders, forward head, flexed trunk , and weight shift left   PALPATION: TTP about Rt medial joint line   LUMBAR ROM:    Active  A/PROM  eval  Flexion WNL  Extension WNL, "feels good"  Right lateral flexion WNL, Rt knee pain  Left lateral flexion WNL  Right rotation WNL  Left rotation WNL   (Blank rows = not tested)     LOWER EXTREMITY MMT:     MMT Right eval Left eval  Hip flexion 4/5p! 5/5  Hip extension 4/5 4/5  Hip  abduction 3+/5 4+/5  Knee flexion 4/5p! 5/5  Knee extension 4/5p! 5/5  Ankle dorsiflexion 5/5 5/5  Ankle plantarflexion 5/5 5/5   (Blank rows = not tested)   LOWER EXTREMITY AROM:     MMT Right eval Left eval  Knee flexion 108/110p! 125/128  Knee extension -3/0p! -2/0   (Blank rows = not tested)   SPECIAL TESTS:  Slump: (-) on Rt SLR: (-) on Rt Lateral pull sign: (+) on Rt Patellar apprehension: (+) on Rt Patellar compression: (+) on Rt McMurray's: (+) on Rt Apley's: (+) on Rt   FUNCTIONAL TESTS:  Squat: 50%, pain and audible clicking in Rt knee Plank: Unable 5xSTS: 18 seconds, increased Rt knee pain     TODAY'S TREATMENT   [facility] Adult PT Treatment:                                                DATE: 08/10/2022 Therapeutic  Exercise: Seated LAQ with YTB 2x10 with 3-sec hold BIL Standing 2-inch lateral heel tap with UE support 2x10 BIL Mini-squat side steps with butt taps to table 2x3 length of table Bridge x10 with 3-sec hold Manual Therapy: N/A Neuromuscular re-ed: N/A Therapeutic Activity: Half dead lift with 5# kettlebell 3x10 Heel raises on 2-inch step with UE support Modalities: N/A Self Care: N/A   [facility] Adult PT Treatment:                                                DATE: 08/03/2022 Therapeutic Exercise: Nustep level 4 x 5 mins LE only Seated marching 2x10 BIL LAQ 2x10 BIL Seated hamstring curl GTB x10 BIL Supine clamshell GTB 2x10  SLR x10 BIL Bridges 2x10 Sidelying hip abduction x10 Lt STS 2x10  07/26/2022: Demonstrated and issued HEP     PATIENT EDUCATION:  Education details: Pt educated on probable underlying pathophysiology, POC, prognosis, FOTO, and HEP Person educated: Patient Education method: Explanation, Demonstration, and Handouts Education comprehension: verbalized understanding and returned demonstration     HOME EXERCISE PROGRAM: Access Code: [email or web address] URL: [email or web address] Date: 07/26/2022 Prepared by: Vanessa [place]   Exercises - Seated Long Arc Quad  - 1 x daily - 7 x weekly - 3 sets - 10 reps - 5 segundos hold - Sidelying Hip Abduction  - 1 x daily - 7 x weekly - 2 sets - 10 reps - 3 segundos hold - Supine 90/90 Abdominal Bracing *with handhold resistance to thighs*  - 1 x daily - 7 x weekly - 3 sets - 30 segundos hold - Supine Bridge  - 1 x daily - 7 x weekly - 3 sets - 10 reps - 5 segundos hold   ASSESSMENT:   CLINICAL IMPRESSION: Pt responded excellently to progressed exercises today, demonstrating good form and no pain. She reports moderate fatigue at the end of the session and reports that the exercises were a good challenge. She will continue to benefit from skilled PT to address her primary impairments and return to her prior  level of function with less limitation.   OBJECTIVE IMPAIRMENTS Abnormal gait, decreased activity tolerance, decreased balance, decreased endurance, decreased mobility, difficulty walking, decreased ROM, decreased strength, hypomobility, increased edema, impaired flexibility, impaired sensation, improper body mechanics, postural dysfunction, and  pain.    ACTIVITY LIMITATIONS bending, sitting, standing, squatting, stairs, transfers, and locomotion level   PARTICIPATION LIMITATIONS: cleaning, laundry, shopping, community activity, and yard work   PERSONAL FACTORS 3+ comorbidities: See medical hx  are also affecting patient's functional outcome.        GOALS: Goals reviewed with patient? Yes   SHORT TERM GOALS: Target date: 08/22/2022   Pt will report understanding and adherence to initial HEP in order to promote independence in the management of primary impairments. Baseline: HEP provided at eval Goal status: INITIAL     LONG TERM GOALS: Target date: 09/19/2022   Pt will achieve a lumbar FOTO score of 73% and a knee FOTO score of 67%  in order to demonstrate improved functional ability as it relates to her primary impairments. Baseline: Lumbar: 65%, knee: 54% Goal status: INITIAL   2.  Pt will achieve 5 full-depth squats with 0-4/10 pain in order to lift groceries from the floor with less limitation. Baseline: 50% depth with 10/10 pain Goal status: INITIAL   3.  Pt will achieve Rt knee flexion AROM of >120 degrees with 0-4/10 pain in order to achieve WNL gait mechanics. Baseline: 108 degrees with 10/10 pain Goal status: INITIAL   4.  Pt will report ability to stand >20 minutes with 0-4/10 pain in order to cook with less limitation. Baseline: >7/10 pain with >10 minutes of standing Goal status: INITIAL   5.  Pt will achieve a 5xSTS of <14 seconds in order to demonstrate safe transfers. Baseline: 18 seconds Goal status: INITIAL       PLAN: PT FREQUENCY: 1x/week   PT  DURATION: 8 weeks   PLANNED INTERVENTIONS: Therapeutic exercises, Therapeutic activity, Neuromuscular re-education, Balance training, Gait training, Patient/Family education, Self Care, Joint mobilization, Stair training, Orthotic/Fit training, DME instructions, Aquatic Therapy, Dry Needling, Electrical stimulation, Spinal mobilization, Cryotherapy, Moist heat, Taping, Vasopneumatic device, Traction, Biofeedback, Ionotophoresis '4mg'$ /ml Dexamethasone, Manual therapy, and Re-evaluation.   PLAN FOR NEXT SESSION: Progress core/ hip/ knee strengthening to tolerance, knee ROM, assessment of Rt hip    Vanessa [place], PT, DPT 08/10/22 7:11 PM

## 2022-08-17 ENCOUNTER — Ambulatory Visit: Payer: Medicare Other

## 2022-08-17 ENCOUNTER — Telehealth: Payer: Self-pay

## 2022-08-17 NOTE — Telephone Encounter (Signed)
Utilized interpreter to leave message. Patient missed appt this evening, message detailed next appt date and time.  [name], PTA 08/17/22 5:21 PM

## 2022-08-24 ENCOUNTER — Ambulatory Visit: Payer: Medicare Other

## 2022-08-24 DIAGNOSIS — G8929 Other chronic pain: Secondary | ICD-10-CM

## 2022-08-24 DIAGNOSIS — M25551 Pain in right hip: Secondary | ICD-10-CM

## 2022-08-24 DIAGNOSIS — M5459 Other low back pain: Secondary | ICD-10-CM | POA: Diagnosis not present

## 2022-08-24 DIAGNOSIS — M6281 Muscle weakness (generalized): Secondary | ICD-10-CM

## 2022-08-24 DIAGNOSIS — R600 Localized edema: Secondary | ICD-10-CM

## 2022-08-24 DIAGNOSIS — R262 Difficulty in walking, not elsewhere classified: Secondary | ICD-10-CM

## 2022-08-24 DIAGNOSIS — M25561 Pain in right knee: Secondary | ICD-10-CM | POA: Diagnosis not present

## 2022-08-24 NOTE — Therapy (Signed)
OUTPATIENT PHYSICAL THERAPY TREATMENT NOTE   Patient Name: Brenda Wyatt MRN: [id number] DOB:08/20/1951, 71 y.o., female Today's Date: 08/24/2022  PCP: Donney Dice, DO REFERRING PROVIDER: Zenia Resides, MD  END OF SESSION:   PT End of Session - 08/24/22 1700     Visit Number 4    Number of Visits 9    Date for PT Re-Evaluation 09/27/22    Authorization Type MCR    Authorization Time Period FOTO v6, v10, kx mod v15    Progress Note Due on Visit 10    PT Start Time 1701    PT Stop Time 1741    PT Time Calculation (min) 40 min    Activity Tolerance Patient tolerated treatment well;Patient limited by pain    Behavior During Therapy WFL for tasks assessed/performed               Past Medical History:  Diagnosis Date   Anxiety    Hypertension    History reviewed. No pertinent surgical history. Patient Active Problem List   Diagnosis Date Noted   Sciatica of right side 04/17/2022   GERD without esophagitis 12/23/2020   Anxiety 12/23/2020   Hypercholesteremia 12/23/2020   Lower extremity edema 07/17/2014   Polyuria 07/17/2014   Acanthosis nigricans 07/17/2014   ENDOGENOUS OBESITY 05/31/2009   CONTACT DERMATITIS&OTHER ECZEMA DUE UNSPEC CAUSE 05/31/2009   DEPRESSION 04/26/2009   Essential hypertension 04/26/2009   GASTRITIS 04/26/2009   CARDIAC MURMUR 04/26/2009   COUGH 04/26/2009    REFERRING DIAG: M54.31 (ICD-10-CM) - Sciatica, right side  THERAPY DIAG:  Chronic pain of right knee  Other low back pain  Muscle weakness (generalized)  Difficulty in walking, not elsewhere classified  Pain in right hip  Localized edema  Rationale for Evaluation and Treatment Rehabilitation  PERTINENT HISTORY: Anxiety, HTN, depression  PRECAUTIONS: None  SUBJECTIVE: Pt reports continued LBP and Rt knee pain. She reports the pain is worse when she walks. She reports continued adherence to her HEP.  PAIN:  Are you having pain? Yes: NPRS scale:  10/10 Pain location: Rt knee, Rt low back, Rt hip Pain description: gnawing Aggravating factors: standing > 10 minutes, walking >1 hour, sitting >2 hours Relieving factors: Tylenol and ice   OBJECTIVE: (objective measures completed at initial evaluation unless otherwise dated)   DIAGNOSTIC FINDINGS:  None available   PATIENT SURVEYS:  FOTO Lumber: 65%, predicted 73% in 9 visits           Knee: 54%, predicted 67% in 12 visits   SCREENING FOR RED FLAGS: Bowel or bladder incontinence: No Cauda equina syndrome: No     COGNITION:           Overall cognitive status: Within functional limits for tasks assessed                          SENSATION: Not tested   MUSCLE LENGTH: Hamstrings: WNL BIL Thomas test: Not assessed   POSTURE: rounded shoulders, forward head, flexed trunk , and weight shift left   PALPATION: TTP about Rt medial joint line   LUMBAR ROM:    Active  A/PROM  eval  Flexion WNL  Extension WNL, "feels good"  Right lateral flexion WNL, Rt knee pain  Left lateral flexion WNL  Right rotation WNL  Left rotation WNL   (Blank rows = not tested)     LOWER EXTREMITY MMT:     MMT Right eval Left eval  Hip flexion 4/5p!  5/5  Hip extension 4/5 4/5  Hip abduction 3+/5 4+/5  Knee flexion 4/5p! 5/5  Knee extension 4/5p! 5/5  Ankle dorsiflexion 5/5 5/5  Ankle plantarflexion 5/5 5/5   (Blank rows = not tested)   LOWER EXTREMITY AROM:     MMT Right eval Left eval  Knee flexion 108/110p! 125/128  Knee extension -3/0p! -2/0   (Blank rows = not tested)   SPECIAL TESTS:  Slump: (-) on Rt SLR: (-) on Rt Lateral pull sign: (+) on Rt Patellar apprehension: (+) on Rt Patellar compression: (+) on Rt McMurray's: (+) on Rt Apley's: (+) on Rt   FUNCTIONAL TESTS:  Squat: 50%, pain and audible clicking in Rt knee Plank: Unable 5xSTS: 18 seconds, increased Rt knee pain     TODAY'S TREATMENT   [facility] Adult PT Treatment:                                                 DATE: 08/24/2022 Therapeutic Exercise: LAQ from 45d to 0d of knee flexion with 15# 3x8 Seated hamstring curls from 45d to 90d of knee flexion with 15# 3x10 Seated marching with handhold resistance through thighs 2x20 Standing abdominal press-down with red physioball at table with alternating hand lifts 3x20 Standing Cybex hip abduction with 25# 2x10 BIL Manual Therapy: N/A Neuromuscular re-ed: N/A Therapeutic Activity: N/A Modalities: N/A Self Care: N/A   [facility] Adult PT Treatment:                                                DATE: 08/10/2022 Therapeutic Exercise: Seated LAQ with YTB 2x10 with 3-sec hold BIL Standing 2-inch lateral heel tap with UE support 2x10 BIL Mini-squat side steps with butt taps to table 2x3 length of table Bridge x10 with 3-sec hold Manual Therapy: N/A Neuromuscular re-ed: N/A Therapeutic Activity: Half dead lift with 5# kettlebell 3x10 Heel raises on 2-inch step with UE support Modalities: N/A Self Care: N/A   [facility] Adult PT Treatment:                                                DATE: 08/03/2022 Therapeutic Exercise: Nustep level 4 x 5 mins LE only Seated marching 2x10 BIL LAQ 2x10 BIL Seated hamstring curl GTB x10 BIL Supine clamshell GTB 2x10  SLR x10 BIL Bridges 2x10 Sidelying hip abduction x10 Lt STS 2x10     PATIENT EDUCATION:  Education details: Pt educated on probable underlying pathophysiology, POC, prognosis, FOTO, and HEP Person educated: Patient Education method: Consulting civil engineer, Demonstration, and Handouts Education comprehension: verbalized understanding and returned demonstration     HOME EXERCISE PROGRAM: Access Code: [email or web address] URL: [email or web address] Date: 07/26/2022 Prepared by: Vanessa [place]   Exercises - Seated Long Arc Quad  - 1 x daily - 7 x weekly - 3 sets - 10 reps - 5 segundos hold - Sidelying Hip Abduction  - 1 x daily - 7 x weekly - 2 sets - 10 reps - 3 segundos hold - Supine  90/90 Abdominal Bracing *with handhold resistance to thighs*  - 1 x daily - 7 x weekly -  3 sets - 30 segundos hold - Supine Bridge  - 1 x daily - 7 x weekly - 3 sets - 10 reps - 5 segundos hold   ASSESSMENT:   CLINICAL IMPRESSION: Pt responded well to all interventions today, demonstrating good form with all exercises, although she continues to be limited by high knee pain with movement. She will continue to benefit from skilled PT to address her primary impairments and return to her prior level of function with less limitation.    OBJECTIVE IMPAIRMENTS Abnormal gait, decreased activity tolerance, decreased balance, decreased endurance, decreased mobility, difficulty walking, decreased ROM, decreased strength, hypomobility, increased edema, impaired flexibility, impaired sensation, improper body mechanics, postural dysfunction, and pain.    ACTIVITY LIMITATIONS bending, sitting, standing, squatting, stairs, transfers, and locomotion level   PARTICIPATION LIMITATIONS: cleaning, laundry, shopping, community activity, and yard work   PERSONAL FACTORS 3+ comorbidities: See medical hx  are also affecting patient's functional outcome.        GOALS: Goals reviewed with patient? Yes   SHORT TERM GOALS: Target date: 08/22/2022   Pt will report understanding and adherence to initial HEP in order to promote independence in the management of primary impairments. Baseline: HEP provided at eval Goal status: INITIAL     LONG TERM GOALS: Target date: 09/19/2022   Pt will achieve a lumbar FOTO score of 73% and a knee FOTO score of 67%  in order to demonstrate improved functional ability as it relates to her primary impairments. Baseline: Lumbar: 65%, knee: 54% Goal status: INITIAL   2.  Pt will achieve 5 full-depth squats with 0-4/10 pain in order to lift groceries from the floor with less limitation. Baseline: 50% depth with 10/10 pain Goal status: INITIAL   3.  Pt will achieve Rt knee flexion  AROM of >120 degrees with 0-4/10 pain in order to achieve WNL gait mechanics. Baseline: 108 degrees with 10/10 pain Goal status: INITIAL   4.  Pt will report ability to stand >20 minutes with 0-4/10 pain in order to cook with less limitation. Baseline: >7/10 pain with >10 minutes of standing Goal status: INITIAL   5.  Pt will achieve a 5xSTS of <14 seconds in order to demonstrate safe transfers. Baseline: 18 seconds Goal status: INITIAL       PLAN: PT FREQUENCY: 1x/week   PT DURATION: 8 weeks   PLANNED INTERVENTIONS: Therapeutic exercises, Therapeutic activity, Neuromuscular re-education, Balance training, Gait training, Patient/Family education, Self Care, Joint mobilization, Stair training, Orthotic/Fit training, DME instructions, Aquatic Therapy, Dry Needling, Electrical stimulation, Spinal mobilization, Cryotherapy, Moist heat, Taping, Vasopneumatic device, Traction, Biofeedback, Ionotophoresis '4mg'$ /ml Dexamethasone, Manual therapy, and Re-evaluation.   PLAN FOR NEXT SESSION: Progress core/ hip/ knee strengthening to tolerance, knee ROM, assessment of Rt hip    Vanessa Emsworth, PT, DPT 08/24/22 5:41 PM

## 2022-08-30 ENCOUNTER — Ambulatory Visit (INDEPENDENT_AMBULATORY_CARE_PROVIDER_SITE_OTHER): Payer: Medicare Other | Admitting: Family Medicine

## 2022-08-30 ENCOUNTER — Ambulatory Visit
Admission: RE | Admit: 2022-08-30 | Discharge: 2022-08-30 | Disposition: A | Discharge status: Home / Self Care | Payer: Medicare Other | Source: Ambulatory Visit | Attending: Family Medicine | Admitting: Family Medicine

## 2022-08-30 ENCOUNTER — Encounter: Payer: Self-pay | Admitting: Family Medicine

## 2022-08-30 VITALS — BP 154/74 | HR 70 | Ht 59.5 in | Wt 200.8 lb

## 2022-08-30 DIAGNOSIS — G8929 Other chronic pain: Secondary | ICD-10-CM | POA: Diagnosis not present

## 2022-08-30 DIAGNOSIS — M25561 Pain in right knee: Secondary | ICD-10-CM | POA: Diagnosis not present

## 2022-08-30 DIAGNOSIS — M170 Bilateral primary osteoarthritis of knee: Secondary | ICD-10-CM | POA: Diagnosis not present

## 2022-08-30 NOTE — Patient Instructions (Signed)
Today I gave you a corticosteroid injection into your knee.  Hopefully this will help your pain some.  I would like you to come back and see your regular doctor in about a month.  I am also ordering some x-rays.  You can have those done on the way home and I will send you a note in the mail about them.  If you have any worsening of symptoms in the interim, please make an appointment sooner.  It was nice to meet you!

## 2022-08-31 DIAGNOSIS — G8929 Other chronic pain: Secondary | ICD-10-CM | POA: Insufficient documentation

## 2022-08-31 NOTE — Progress Notes (Signed)
    CHIEF COMPLAINT / HPI: Chronic right knee pain, worse recently.  Has had this for a long time and nothing seems to help.  She has tried physical therapy, over-the-counter medications.  Hurting so bad that she feels like she is walking differently and that is causing her low back pain which is also chronic to get a little worse.  She is here with her daughter who interprets for her today.   PERTINENT  PMH / PSH: I have reviewed the patient's medications, allergies, past medical and surgical history, smoking status and updated in the EMR as appropriate. No prior x-rays of the knee available in EMR.  OBJECTIVE:  BP (!) 154/74   Pulse 70   Ht 4' 11.5" (1.511 m)   Wt 200 lb 12.8 oz (91.1 kg)   SpO2 100%   BMI 39.88 kg/m  GENERAL: Well-developed female no acute distress RIGHT knee: Medial and lateral joint line tenderness.  She has no effusion she has some stiffness but can achieve full extension.  Ligamentously intact to varus and valgus stress.  PROCEDURE: INJECTION: Patient was given informed consent, signed copy in the chart. Appropriate time out was taken. Area prepped and draped in usual sterile fashion. Ethyl chloride was  used for local anesthesia. A 21 gauge 1 1/2 inch needle was used..  1 cc of methylprednisolone 40 mg/ml plus 2 cc of 2% lidocaine without epinephrine was injected into the right knee using a(n) anterior medial approach.   The patient tolerated the procedure well. There were no complications. Post procedure instructions were given.   ASSESSMENT / PLAN:   Chronic pain of right knee Exam and story consistent with osteoarthritis.  Will order x-rays for some idea of where she is in this process.  I do think this is OA and will try treatment per our discussion with corticosteroid injection today.  I will notify her of x-ray results.  I would like her to come back in 1 month for follow-up with either me or her regular provider.   Brenda Mcmurray MD

## 2022-08-31 NOTE — Assessment & Plan Note (Signed)
Exam and story consistent with osteoarthritis.  Will order x-rays for some idea of where she is in this process.  I do think this is OA and will try treatment per our discussion with corticosteroid injection today.  I will notify her of x-ray results.  I would like her to come back in 1 month for follow-up with either me or her regular provider.

## 2022-09-04 ENCOUNTER — Encounter: Payer: Self-pay | Admitting: Family Medicine

## 2022-09-06 ENCOUNTER — Telehealth: Payer: Self-pay

## 2022-09-06 NOTE — Telephone Encounter (Signed)
Patient relative calling regarding results of imagining would like to know results if you could please call

## 2022-09-07 ENCOUNTER — Telehealth: Payer: Self-pay

## 2022-09-07 ENCOUNTER — Ambulatory Visit: Payer: Medicare Other

## 2022-09-07 ENCOUNTER — Ambulatory Visit (INDEPENDENT_AMBULATORY_CARE_PROVIDER_SITE_OTHER): Payer: Medicare Other | Admitting: Student

## 2022-09-07 ENCOUNTER — Encounter: Payer: Self-pay | Admitting: Student

## 2022-09-07 VITALS — BP 151/68 | HR 83 | Ht 59.0 in | Wt 200.0 lb

## 2022-09-07 DIAGNOSIS — Z0000 Encounter for general adult medical examination without abnormal findings: Secondary | ICD-10-CM | POA: Diagnosis not present

## 2022-09-07 DIAGNOSIS — M25561 Pain in right knee: Secondary | ICD-10-CM | POA: Diagnosis not present

## 2022-09-07 DIAGNOSIS — Z1211 Encounter for screening for malignant neoplasm of colon: Secondary | ICD-10-CM

## 2022-09-07 DIAGNOSIS — I10 Essential (primary) hypertension: Secondary | ICD-10-CM

## 2022-09-07 DIAGNOSIS — Z23 Encounter for immunization: Secondary | ICD-10-CM | POA: Diagnosis not present

## 2022-09-07 DIAGNOSIS — R7303 Prediabetes: Secondary | ICD-10-CM

## 2022-09-07 DIAGNOSIS — F419 Anxiety disorder, unspecified: Secondary | ICD-10-CM | POA: Diagnosis not present

## 2022-09-07 DIAGNOSIS — G8929 Other chronic pain: Secondary | ICD-10-CM | POA: Diagnosis not present

## 2022-09-07 DIAGNOSIS — L299 Pruritus, unspecified: Secondary | ICD-10-CM | POA: Diagnosis not present

## 2022-09-07 LAB — POCT GLYCOSYLATED HEMOGLOBIN (HGB A1C): Hemoglobin A1C: 5.8 % — AB (ref 4.0–5.6)

## 2022-09-07 MED ORDER — AMLODIPINE BESYLATE-VALSARTAN 5-160 MG PO TABS: 1.0000 | ORAL_TABLET | Freq: Every day | ORAL | 1 refills | Status: DC

## 2022-09-07 MED ORDER — FEXOFENADINE HCL 180 MG PO TABS: 180.0000 mg | ORAL_TABLET | Freq: Every day | ORAL | Status: DC

## 2022-09-07 MED ORDER — TRIAMCINOLONE ACETONIDE 0.5 % EX OINT: 1.0000 | TOPICAL_OINTMENT | Freq: Two times a day (BID) | CUTANEOUS | 0 refills | Status: DC

## 2022-09-07 MED ORDER — VALSARTAN-HYDROCHLOROTHIAZIDE 160-25 MG PO TABS: 1.0000 | ORAL_TABLET | Freq: Every day | ORAL | 0 refills | Status: DC

## 2022-09-07 NOTE — Patient Instructions (Signed)
I have translated the following text using Google translate.  As such, there are many errors.  I apologize for the poor written translation; however, we do not have written  translation services yet. It was wonderful to meet you today. Thank you for allowing me to be a part of your care. Below is a short summary of what we discussed at your visit today: He traducido el siguiente texto [employer] traductor de [employer]. Como tal, hay muchos errores. Pido disculpas por la mala traduccin escrita; sin embargo, an no contamos con servicios de traduccin escrita. Fue maravilloso conocerte hoy. Gracias por permitirme ser parte de su cuidado. A continuacin se muestra un breve resumen de lo que discutimos en su visita de hoy:  Abdominal rash I sent in allergy medication and a topical cream to help with your rash.  I also ordered a CBC and CMP to check for your blood levels and liver and kidney function.  If anything is abnormal I will send you a message and call you.  Envi medicamentos para las alergias y una crema tpica para ayudar con el sarpullido. Tambin orden un CBC y un CMP para verificar sus niveles sanguneos y la funcin heptica y renal. Si hay algo anormal te enviar un mensaje y te llamar.  Colonoscopy We recommend colonoscopy to screen for colon cancer. I have referred you to the gastroenterology clinic (GI clinic) for colonoscopy discussion.  Someone from their office should be calling you in 1 to 2 weeks to schedule an appointment.  If you do not hear from them, let us know. We may need to nudge along the referral.   Colonoscopia Recomendamos la colonoscopia para detectar cncer de colon. Lo he remitido a la Automotive engineer (clnica gastrointestinal) para una discusin sobre colonoscopia. Alguien de su oficina debera llamarlo en 1 o 2 semanas para programar una cita. Si no tiene noticias suyas, hganoslo saber. Es posible que necesitemos avanzar en la derivacin.  Vaccines Please  go to your favorite pharmacy to get your Prevnar vaccine (pneumococcal pneumonia vaccine), Tdap vaccine (against tetanus and pertussis), shingles vaccine, and your COVID-vaccine.  Your insurance company will not pay to have these vaccines at our clinic, they want you to go to the pharmacy for this. Ron Parker a su farmacia favorita para recibir Public relations account executive (vacuna contra la neumona neumoccica), la vacuna Tdap (contra el ttanos y la tos [place]), la vacuna contra el herpes zster y su vacuna contra el COVID. Tu compaa de seguros no pagar por tener estas vacunas en nuestra clnica, quieren que vayas a la farmacia para ello.   If you have any questions or concerns, please do not hesitate to contact us via phone or MyChart message.  Si tiene alguna pregunta o inquietud, no dude en comunicarse con nosotros por telfono o mensaje de MyChart.  Darci Current, DO

## 2022-09-07 NOTE — Assessment & Plan Note (Signed)
While walking out of the room patient expressed anxiety with her knee pain.  - asked patient to schedule an appointment to follow up about her anxiety before prescribing medication

## 2022-09-07 NOTE — Assessment & Plan Note (Signed)
Elevated in the office. Encouraged patient to continue taking Exforge and Diovan.  - BP meds refilled.

## 2022-09-07 NOTE — Assessment & Plan Note (Signed)
Educated patient on symptomatic relief for OA. X-rays consistent with moderate to severe arthritis pain. Patient understands that knee replacement surgery is the next step in management.  - continue to follow up with sports medicine for injections  - continue Voltaren gel  - continue tylenol

## 2022-09-07 NOTE — Assessment & Plan Note (Signed)
Itching to abdomen appears to be an allergic reaction but will obtain CBC and CMP to assess liver function and blood levels.  - prescribed allegra  - prescribed Kenalog ointment  - ordered CBC and CMP  - follow up in 3 months

## 2022-09-07 NOTE — Assessment & Plan Note (Addendum)
-   Referral to GI for colonoscopy ordered  - talk about DEXA scan next visit  - Flu shot given - talk about pneumonia, tetanus and shingrix at next visit, patient encouraged to go to pharmacy for these vaccines.

## 2022-09-07 NOTE — Telephone Encounter (Signed)
Pharmacy calls nurse line requesting clarification on blood pressure medications sent in today.   Pharmacy reports both Exforge and Diovan contain Valsartan. Pharmacist wants to make sure prescriber is aware before dispensing.   I do see in your note to continue both medications.   Will forward to PCP for clarification.

## 2022-09-07 NOTE — Progress Notes (Signed)
    SUBJECTIVE:   CHIEF COMPLAINT / HPI:   Brenda Wyatt is a 71 y.o. female presenting for a rash around her abdomen that is red and itchy. This started 4 days ago. She has not tried anything over the counter at home.   She is concerned about her knee arthritis pain and would like to know if there is anything additional we can do to help her with symptomatic control.    PERTINENT  PMH / PSH: HTN, GERD, Depression, Anxiety  OBJECTIVE:   BP (!) 151/68   Pulse 83   Ht '4\' 11"'$  (1.499 m)   Wt 200 lb (90.7 kg)   SpO2 100%   BMI 40.40 kg/m   Well-appearing, no acute distress Cardio: Regular rate, regular rhythm, no murmurs on exam. Pulm: Clear, no wheezing, no crackles. No increased work of breathing Abdominal: bowel sounds present, soft, non-tender, non-distended Extremities: no peripheral edema  Skin: no signs of rash on the stomach, no signs of excoriations, no signs of infection, no interdigital excoriations    ASSESSMENT/PLAN:   Healthcare maintenance - Referral to GI for colonoscopy ordered  - talk about DEXA scan next visit  - Flu shot given - talk about pneumonia, tetanus and shingrix at next visit, patient encouraged to go to pharmacy for these vaccines.   Chronic pain of right knee Educated patient on symptomatic relief for OA. X-rays consistent with moderate to severe arthritis pain. Patient understands that knee replacement surgery is the next step in management.  - continue to follow up with sports medicine for injections  - continue Voltaren gel  - continue tylenol   Anxiety While walking out of the room patient expressed anxiety with her knee pain.  - asked patient to schedule an appointment to follow up about her anxiety before prescribing medication   Essential hypertension Elevated in the office. Encouraged patient to continue taking Exforge and Diovan.  - BP meds refilled.   Pruritus Itching to abdomen appears to be an allergic reaction but  will obtain CBC and CMP to assess liver function and blood levels.  - prescribed allegra  - prescribed Kenalog ointment  - ordered CBC and CMP  - follow up in 3 months     Darci Current, [place]

## 2022-09-08 LAB — CBC
Hematocrit: 40.1 % (ref 34.0–46.6)
Hemoglobin: 13.3 g/dL (ref 11.1–15.9)
MCH: 30.4 pg (ref 26.6–33.0)
MCHC: 33.2 g/dL (ref 31.5–35.7)
MCV: 92 fL (ref 79–97)
Platelets: 242 10*3/uL (ref 150–450)
RBC: 4.37 x10E6/uL (ref 3.77–5.28)
RDW: 12.6 % (ref 11.7–15.4)
WBC: 6.5 10*3/uL (ref 3.4–10.8)

## 2022-09-08 LAB — COMPREHENSIVE METABOLIC PANEL
ALT: 15 IU/L (ref 0–32)
AST: 19 IU/L (ref 0–40)
Albumin/Globulin Ratio: 1.3 (ref 1.2–2.2)
Albumin: 4.4 g/dL (ref 3.9–4.9)
Alkaline Phosphatase: 132 IU/L — ABNORMAL HIGH (ref 44–121)
BUN/Creatinine Ratio: 26 (ref 12–28)
BUN: 22 mg/dL (ref 8–27)
Bilirubin Total: 0.4 mg/dL (ref 0.0–1.2)
CO2: 25 mmol/L (ref 20–29)
Calcium: 8.9 mg/dL (ref 8.7–10.3)
Chloride: 101 mmol/L (ref 96–106)
Creatinine, Ser: 0.85 mg/dL (ref 0.57–1.00)
Globulin, Total: 3.4 g/dL (ref 1.5–4.5)
Glucose: 97 mg/dL (ref 70–99)
Potassium: 4.1 mmol/L (ref 3.5–5.2)
Sodium: 143 mmol/L (ref 134–144)
Total Protein: 7.8 g/dL (ref 6.0–8.5)
eGFR: 74 mL/min/{1.73_m2} (ref >59)

## 2022-09-08 MED ORDER — DICLOFENAC SODIUM 1 % EX GEL: 4.0000 g | Freq: Four times a day (QID) | CUTANEOUS | Status: DC

## 2022-09-08 MED ORDER — AMLODIPINE BESYLATE-VALSARTAN 5-160 MG PO TABS: 1.0000 | ORAL_TABLET | Freq: Every day | ORAL | 1 refills | Status: DC

## 2022-09-08 NOTE — Telephone Encounter (Signed)
Called the patient to discuss her lab results yesterday which were normal.  She has not yet picked up the cream for the itch or the allergy medication.   Received call from pharmacy about filling Exforge and Diovan/HCTZ.  I asked patient how many blood pressure pills she is taking and she only reports taking 1.  She does not remember which medication she is taking.  I will send in the Exforge and discontinue the Diovan/HCTZ at this time until she can follow-up with her PCP for blood pressure medication titration.  She would also like for me to send in a cream for her knee pain.  I will prescribe Voltaren gel that she can apply 3-4 times daily.  Darci Current, DO [facility], PGY-1 09/08/22 10:19 AM

## 2022-09-11 ENCOUNTER — Telehealth: Payer: Self-pay

## 2022-09-11 NOTE — Telephone Encounter (Signed)
Patient's daughter in law, Verdis Frederickson, calls nurse line requesting referral for orthopedic to discuss knee replacement options.   Patient is also needing voltaren gel and allegra. These were ordered as clinic administered medications. Please place new orders. Pended medications, however, new orders will need to include dispense quantity.   Forwarding to Dr. Sabra Heck.   Talbot Grumbling, RN

## 2022-09-12 MED ORDER — FEXOFENADINE HCL 180 MG PO TABS: 180.0000 mg | ORAL_TABLET | Freq: Every day | ORAL | 0 refills | Status: AC

## 2022-09-12 MED ORDER — DICLOFENAC SODIUM 1 % EX GEL: 4.0000 g | Freq: Four times a day (QID) | CUTANEOUS | 0 refills | Status: AC

## 2022-09-14 ENCOUNTER — Ambulatory Visit: Payer: Medicare Other | Attending: Family Medicine

## 2022-09-14 DIAGNOSIS — R600 Localized edema: Secondary | ICD-10-CM | POA: Diagnosis not present

## 2022-09-14 DIAGNOSIS — M25551 Pain in right hip: Secondary | ICD-10-CM | POA: Diagnosis not present

## 2022-09-14 DIAGNOSIS — M5459 Other low back pain: Secondary | ICD-10-CM | POA: Insufficient documentation

## 2022-09-14 DIAGNOSIS — G8929 Other chronic pain: Secondary | ICD-10-CM | POA: Diagnosis not present

## 2022-09-14 DIAGNOSIS — M25561 Pain in right knee: Secondary | ICD-10-CM | POA: Insufficient documentation

## 2022-09-14 DIAGNOSIS — M6281 Muscle weakness (generalized): Secondary | ICD-10-CM | POA: Insufficient documentation

## 2022-09-14 DIAGNOSIS — R262 Difficulty in walking, not elsewhere classified: Secondary | ICD-10-CM | POA: Diagnosis not present

## 2022-09-14 NOTE — Therapy (Addendum)
OUTPATIENT PHYSICAL THERAPY TREATMENT NOTE/ DISCHARGE SUMMARY   Patient Name: Brenda Wyatt MRN: [id number] DOB:12-25-1950, 71 y.o., female Today's Date: 09/14/2022  PCP: Donney Dice, DO REFERRING PROVIDER: Zenia Resides, MD  END OF SESSION:   PT End of Session - 09/14/22 1736     Visit Number 5    Number of Visits 9    Date for PT Re-Evaluation 09/27/22    Authorization Type MCR    Authorization Time Period FOTO v6, v10, kx mod v15    Progress Note Due on Visit 10    PT Start Time 1740    PT Stop Time 1820    PT Time Calculation (min) 40 min    Activity Tolerance Patient tolerated treatment well;Patient limited by pain    Behavior During Therapy WFL for tasks assessed/performed                Past Medical History:  Diagnosis Date   Anxiety    Hypertension    History reviewed. No pertinent surgical history. Patient Active Problem List   Diagnosis Date Noted   Healthcare maintenance 09/07/2022   Pruritus 09/07/2022   Chronic pain of right knee 08/31/2022   Sciatica of right side 04/17/2022   GERD without esophagitis 12/23/2020   Anxiety 12/23/2020   Hypercholesteremia 12/23/2020   Lower extremity edema 07/17/2014   Polyuria 07/17/2014   Acanthosis nigricans 07/17/2014   ENDOGENOUS OBESITY 05/31/2009   CONTACT DERMATITIS&OTHER ECZEMA DUE UNSPEC CAUSE 05/31/2009   DEPRESSION 04/26/2009   Essential hypertension 04/26/2009   GASTRITIS 04/26/2009   CARDIAC MURMUR 04/26/2009   COUGH 04/26/2009    REFERRING DIAG: M54.31 (ICD-10-CM) - Sciatica, right side  THERAPY DIAG:  Chronic pain of right knee  Other low back pain  Muscle weakness (generalized)  Difficulty in walking, not elsewhere classified  Pain in right hip  Localized edema  Rationale for Evaluation and Treatment Rehabilitation  PERTINENT HISTORY: Anxiety, HTN, depression  PRECAUTIONS: None  SUBJECTIVE: Pt reports she can tell she is getting better. She rates her pain  as 3/10. She reports it is more painful when walking and getting up in the morning. She also reports adherence to her HEP.  PAIN:  Are you having pain? Yes: NPRS scale: 3/10 Pain location: Rt knee, Rt low back, Rt hip Pain description: gnawing Aggravating factors: standing > 10 minutes, walking >1 hour, sitting >2 hours Relieving factors: Tylenol and ice   OBJECTIVE: (objective measures completed at initial evaluation unless otherwise dated)   DIAGNOSTIC FINDINGS:  None available   PATIENT SURVEYS:  FOTO Lumber: 65%, predicted 73% in 9 visits           Knee: 54%, predicted 67% in 12 visits   SCREENING FOR RED FLAGS: Bowel or bladder incontinence: No Cauda equina syndrome: No     COGNITION:           Overall cognitive status: Within functional limits for tasks assessed                          SENSATION: Not tested   MUSCLE LENGTH: Hamstrings: WNL BIL Thomas test: Not assessed   POSTURE: rounded shoulders, forward head, flexed trunk , and weight shift left   PALPATION: TTP about Rt medial joint line   LUMBAR ROM:    Active  A/PROM  eval  Flexion WNL  Extension WNL, "feels good"  Right lateral flexion WNL, Rt knee pain  Left lateral flexion WNL  Right rotation  WNL  Left rotation WNL   (Blank rows = not tested)     LOWER EXTREMITY MMT:     MMT Right eval Left eval  Hip flexion 4/5p! 5/5  Hip extension 4/5 4/5  Hip abduction 3+/5 4+/5  Knee flexion 4/5p! 5/5  Knee extension 4/5p! 5/5  Ankle dorsiflexion 5/5 5/5  Ankle plantarflexion 5/5 5/5   (Blank rows = not tested)   LOWER EXTREMITY AROM:     MMT Right eval Left eval Right 09/14/2022  Knee flexion 108/110p! 125/128 108/ 110p!  Knee extension -3/0p! -2/0    (Blank rows = not tested)   SPECIAL TESTS:  Slump: (-) on Rt SLR: (-) on Rt Lateral pull sign: (+) on Rt Patellar apprehension: (+) on Rt Patellar compression: (+) on Rt McMurray's: (+) on Rt Apley's: (+) on Rt   FUNCTIONAL TESTS:   Squat: 50%, pain and audible clicking in Rt knee Plank: Unable 5xSTS: 18 seconds, increased Rt knee pain  09/14/2022: 5xSTS: 12 seconds     TODAY'S TREATMENT   [facility] Adult PT Treatment:                                                DATE: 09/14/2022 Therapeutic Exercise: Standing hip flexion with subsequent knee extension with 3# cable 2x10 BIL Standing hip abduction with 3# cable 2x10 BIL 2-inch lateral heel taps 2x10 BIL with UE support Manual Therapy: N/A Neuromuscular re-ed: N/A Therapeutic Activity: Re-assessment of objective measures with pt education Dead lift with 25# kettlebell 3x8 with cues for functional lifting  Modalities: N/A Self Care: N/A   [facility] Adult PT Treatment:                                                DATE: 08/24/2022 Therapeutic Exercise: LAQ from 45d to 0d of knee flexion with 15# 3x8 Seated hamstring curls from 45d to 90d of knee flexion with 15# 3x10 Seated marching with handhold resistance through thighs 2x20 Standing abdominal press-down with red physioball at table with alternating hand lifts 3x20 Standing Cybex hip abduction with 25# 2x10 BIL Manual Therapy: N/A Neuromuscular re-ed: N/A Therapeutic Activity: N/A Modalities: N/A Self Care: N/A   [facility] Adult PT Treatment:                                                DATE: 08/10/2022 Therapeutic Exercise: Seated LAQ with YTB 2x10 with 3-sec hold BIL Standing 2-inch lateral heel tap with UE support 2x10 BIL Mini-squat side steps with butt taps to table 2x3 length of table Bridge x10 with 3-sec hold Manual Therapy: N/A Neuromuscular re-ed: N/A Therapeutic Activity: Half dead lift with 5# kettlebell 3x10 Heel raises on 2-inch step with UE support Modalities: N/A Self Care: N/A      PATIENT EDUCATION:  Education details: Pt educated on probable underlying pathophysiology, POC, prognosis, FOTO, and HEP Person educated: Patient Education method: Consulting civil engineer, Demonstration,  and Handouts Education comprehension: verbalized understanding and returned demonstration     HOME EXERCISE PROGRAM: Access Code: [email or web address] URL: [email or web address] Date: 07/26/2022 Prepared by: Vanessa [place]   Exercises -  Seated Long Arc Quad  - 1 x daily - 7 x weekly - 3 sets - 10 reps - 5 segundos hold - Sidelying Hip Abduction  - 1 x daily - 7 x weekly - 2 sets - 10 reps - 3 segundos hold - Supine 90/90 Abdominal Bracing *with handhold resistance to thighs*  - 1 x daily - 7 x weekly - 3 sets - 30 segundos hold - Supine Bridge  - 1 x daily - 7 x weekly - 3 sets - 10 reps - 5 segundos hold   ASSESSMENT:   CLINICAL IMPRESSION: Upon re-assessment of objective measures, the pt's Rt knee ROM is unchanged, although her functional capacity (5xSTS) and pain levels have greatly improved. She responded well to interventions today and will continue to benefit from skilled PT to address her primary impairments and return to her prior level of function with less limitation.   OBJECTIVE IMPAIRMENTS Abnormal gait, decreased activity tolerance, decreased balance, decreased endurance, decreased mobility, difficulty walking, decreased ROM, decreased strength, hypomobility, increased edema, impaired flexibility, impaired sensation, improper body mechanics, postural dysfunction, and pain.    ACTIVITY LIMITATIONS bending, sitting, standing, squatting, stairs, transfers, and locomotion level   PARTICIPATION LIMITATIONS: cleaning, laundry, shopping, community activity, and yard work   PERSONAL FACTORS 3+ comorbidities: See medical hx  are also affecting patient's functional outcome.        GOALS: Goals reviewed with patient? Yes   SHORT TERM GOALS: Target date: 08/22/2022   Pt will report understanding and adherence to initial HEP in order to promote independence in the management of primary impairments. Baseline: HEP provided at eval 09/14/2022: Pt reports HEP adherence Goal  status: ACHIEVED     LONG TERM GOALS: Target date: 09/19/2022   Pt will achieve a lumbar FOTO score of 73% and a knee FOTO score of 67%  in order to demonstrate improved functional ability as it relates to her primary impairments. Baseline: Lumbar: 65%, knee: 54% Goal status: INITIAL   2.  Pt will achieve 5 full-depth squats with 0-4/10 pain in order to lift groceries from the floor with less limitation. Baseline: 50% depth with 10/10 pain Goal status: INITIAL   3.  Pt will achieve Rt knee flexion AROM of >120 degrees with 0-4/10 pain in order to achieve WNL gait mechanics. Baseline: 108 degrees with 10/10 pain 09/14/2022: 108 degrees with 2/10 pain Goal status: IN PROGRESS   4.  Pt will report ability to stand >20 minutes with 0-4/10 pain in order to cook with less limitation. Baseline: >7/10 pain with >10 minutes of standing Goal status: INITIAL   5.  Pt will achieve a 5xSTS of <14 seconds in order to demonstrate safe transfers. Baseline: 18 seconds 09/14/2022: 12 seconds Goal status: ACHIEVED       PLAN: PT FREQUENCY: 1x/week   PT DURATION: 8 weeks   PLANNED INTERVENTIONS: Therapeutic exercises, Therapeutic activity, Neuromuscular re-education, Balance training, Gait training, Patient/Family education, Self Care, Joint mobilization, Stair training, Orthotic/Fit training, DME instructions, Aquatic Therapy, Dry Needling, Electrical stimulation, Spinal mobilization, Cryotherapy, Moist heat, Taping, Vasopneumatic device, Traction, Biofeedback, Ionotophoresis 100m/ml Dexamethasone, Manual therapy, and Re-evaluation.   PLAN FOR NEXT SESSION: Progress core/ hip/ knee strengthening to tolerance, knee ROM, assessment of Rt hip    YVanessa Cadiz PT, DPT 09/14/22 6:22 PM  PHYSICAL THERAPY DISCHARGE SUMMARY  Visits from Start of Care: 5  Current functional level related to goals / functional outcomes: Pt made good progress in her 5xSTS score.   Remaining deficits:  Pt  remains limited in knee ROM, pain levels, and functional mobility.   Education / Equipment: HEP   Patient agrees to discharge. Patient goals were not met. Patient is being discharged due to not returning since the last visit.  Vanessa [place], PT, DPT 09/27/22 5:25 PM

## 2022-09-19 ENCOUNTER — Ambulatory Visit (AMBULATORY_SURGERY_CENTER): Payer: Self-pay

## 2022-09-19 VITALS — Ht 59.0 in | Wt 199.0 lb

## 2022-09-19 DIAGNOSIS — Z1211 Encounter for screening for malignant neoplasm of colon: Secondary | ICD-10-CM

## 2022-09-19 MED ORDER — PEG 3350-KCL-NA BICARB-NACL 420 G PO SOLR: 4000.0000 mL | Freq: Once | ORAL | 0 refills | Status: AC

## 2022-09-19 NOTE — Progress Notes (Signed)
No egg or soy allergy known to patient  No issues known to pt with past sedation with any surgeries or procedures Patient denies ever being told they had issues or difficulty with intubation  No FH of Malignant Hyperthermia Pt is not on diet pills Pt is not on  home 02  Pt is not on blood thinners  Pt denies issues with constipation  No A fib or A flutter Have any cardiac testing pending--denied Pt instructed to use [email or web address] or GoodRx for a price reduction on prep   PV with assistance of Spanish interpreter for verbal instruction and written instruction/consent as pt cannot read (Spanish or [place]) Daughter-in-law present with visit, also speaks/reads no [place].

## 2022-09-21 ENCOUNTER — Ambulatory Visit: Payer: Medicare Other

## 2022-09-21 ENCOUNTER — Telehealth: Payer: Self-pay

## 2022-09-21 NOTE — Telephone Encounter (Signed)
Spoke with pt's daughter-in-law, who brings patient to her appointments, via [employer] interpretation. Discussed pt's 2nd no show, discussed attendance policy, and confirmed pt's last remaining appointment for next week.

## 2022-09-27 ENCOUNTER — Ambulatory Visit: Payer: Medicare Other

## 2022-10-02 ENCOUNTER — Ambulatory Visit: Payer: Medicare Other | Admitting: Family Medicine

## 2022-10-02 ENCOUNTER — Other Ambulatory Visit: Payer: Self-pay

## 2022-10-02 ENCOUNTER — Ambulatory Visit (INDEPENDENT_AMBULATORY_CARE_PROVIDER_SITE_OTHER): Payer: Medicare Other | Admitting: Family Medicine

## 2022-10-02 VITALS — BP 140/66 | HR 80 | Wt 199.2 lb

## 2022-10-02 DIAGNOSIS — M170 Bilateral primary osteoarthritis of knee: Secondary | ICD-10-CM | POA: Diagnosis not present

## 2022-10-02 DIAGNOSIS — I10 Essential (primary) hypertension: Secondary | ICD-10-CM | POA: Diagnosis not present

## 2022-10-02 MED ORDER — MELOXICAM 15 MG PO TABS: 15.0000 mg | ORAL_TABLET | Freq: Every day | ORAL | 0 refills | Status: DC

## 2022-10-02 NOTE — Progress Notes (Signed)
    SUBJECTIVE:   CHIEF COMPLAINT / HPI:   Brenda Wyatt is a 71 y.o. female who presents to the [facility] clinic today to discuss the following concerns:   Discuss Orthopedic Referral Patient has OA of bilateral knees. She has done PT and used over the counter medications for her pain. She received to corticosteroid knee injection on 9/27.   Bilateral knee x-rays on 9/28 showed moderate to severe bilateral knee arthritis worse involving the medial joint spaces. She presents today to discuss referral to Ortho for definitive treatment options.   HTN Brought pill bottle with her which is Valsartan-HCTZ 160-25 mg. On med rec she is prescribed Amlodipine-Valsartan 5-160 mg. Takes BP at home and she reports that it is typically around 110-120s.   PERTINENT  PMH / PSH: Chronic pain of right knee  OBJECTIVE:   BP (!) 140/66   Pulse 80   Wt 199 lb 3.2 oz (90.4 kg)   SpO2 98%   BMI 40.23 kg/m    General: NAD, pleasant, able to participate in exam Respiratory: normal effort MSK: Antalgic gait. Arthritic changes b/l knees without effusion.  Extremities: no edema or cyanosis. Psych: Normal affect and mood  ASSESSMENT/PLAN:   Osteoarthritis of both knees Right knee appears most bothersome to her.  She has tried over-the-counter and conservative measures without much relief.  Looking for definitive treatment such as knee replacements. -Referral to orthopedics -Discussed importance of weight loss -Rx meloxicam for pain relief in the interim  Essential hypertension Elevated today on both checks.  She reports her home blood pressures are better and reports feeling anxious in the office. -Discussed BP goal of less than 130/80 -No changes to current regimen given home blood pressures are at goal    Sharion Settler, [place]

## 2022-10-02 NOTE — Assessment & Plan Note (Signed)
Elevated today on both checks.  She reports her home blood pressures are better and reports feeling anxious in the office. -Discussed BP goal of less than 130/80 -No changes to current regimen given home blood pressures are at goal

## 2022-10-02 NOTE — Patient Instructions (Signed)
Fue maravilloso verte hoy.  Por favor traiga TODOS sus medicamentos a cada visita.  Hoy hablamos de:  Le recet Meloxicam que puede tomar Brenda Wyatt al da. Tmalo con comida.  Envi una derivacin a [employer] para Physiological scientist las opciones definitivas para su osteoartritis.  [place] Pert por elegir Medicina familiar [place].  Llame al [phone] si tiene alguna pregunta sobre la cita de [place].  Asegrese de programar un seguimiento en la recepcin antes de irse hoy.  Sharion Settler, D.O. PGY-3 Medicina Familiar

## 2022-10-02 NOTE — Assessment & Plan Note (Signed)
Right knee appears most bothersome to her.  She has tried over-the-counter and conservative measures without much relief.  Looking for definitive treatment such as knee replacements. -Referral to orthopedics -Discussed importance of weight loss -Rx meloxicam for pain relief in the interim

## 2022-10-03 ENCOUNTER — Ambulatory Visit (AMBULATORY_SURGERY_CENTER): Payer: Medicare Other | Admitting: Gastroenterology

## 2022-10-03 ENCOUNTER — Encounter: Payer: Self-pay | Admitting: Gastroenterology

## 2022-10-03 VITALS — BP 107/65 | HR 65 | Temp 97.1°F | Resp 17 | Ht 59.0 in | Wt 199.0 lb

## 2022-10-03 DIAGNOSIS — D122 Benign neoplasm of ascending colon: Secondary | ICD-10-CM

## 2022-10-03 DIAGNOSIS — F419 Anxiety disorder, unspecified: Secondary | ICD-10-CM | POA: Diagnosis not present

## 2022-10-03 DIAGNOSIS — K635 Polyp of colon: Secondary | ICD-10-CM | POA: Diagnosis not present

## 2022-10-03 DIAGNOSIS — Z1211 Encounter for screening for malignant neoplasm of colon: Secondary | ICD-10-CM

## 2022-10-03 DIAGNOSIS — I10 Essential (primary) hypertension: Secondary | ICD-10-CM | POA: Diagnosis not present

## 2022-10-03 DIAGNOSIS — E785 Hyperlipidemia, unspecified: Secondary | ICD-10-CM | POA: Diagnosis not present

## 2022-10-03 MED ORDER — SODIUM CHLORIDE 0.9 % IV SOLN: 500.0000 mL | Freq: Once | INTRAVENOUS | Status: DC

## 2022-10-03 NOTE — Progress Notes (Signed)
Pt resting comfortably. VSS. Airway intact. SBAR complete to RN. All questions answered.   

## 2022-10-03 NOTE — Patient Instructions (Signed)
Discharge instructions given. Handouts on polyps,diverticulosis and hemorrhoids. Resume previous medications. YOU HAD AN ENDOSCOPIC PROCEDURE TODAY AT THE [facility]:   Refer to the procedure report that was given to you for any specific questions about what was found during the examination.  If the procedure report does not answer your questions, please call your gastroenterologist to clarify.  If you requested that your care partner not be given the details of your procedure findings, then the procedure report has been included in a sealed envelope for you to review at your convenience later.  YOU SHOULD EXPECT: Some feelings of bloating in the abdomen. Passage of more gas than usual.  Walking can help get rid of the air that was put into your GI tract during the procedure and reduce the bloating. If you had a lower endoscopy (such as a colonoscopy or flexible sigmoidoscopy) you may notice spotting of blood in your stool or on the toilet paper. If you underwent a bowel prep for your procedure, you may not have a normal bowel movement for a few days.  Please Note:  You might notice some irritation and congestion in your nose or some drainage.  This is from the oxygen used during your procedure.  There is no need for concern and it should clear up in a day or so.  SYMPTOMS TO REPORT IMMEDIATELY:  Following lower endoscopy (colonoscopy or flexible sigmoidoscopy):  Excessive amounts of blood in the stool  Significant tenderness or worsening of abdominal pains  Swelling of the abdomen that is new, acute  Fever of 100F or higher   For urgent or emergent issues, a gastroenterologist can be reached at any hour by calling [phone]. Do not use MyChart messaging for urgent concerns.    DIET:  We do recommend a small meal at first, but then you may proceed to your regular diet.  Drink plenty of fluids but you should avoid alcoholic beverages for 24 hours.  ACTIVITY:  You should  plan to take it easy for the rest of today and you should NOT DRIVE or use heavy machinery until tomorrow (because of the sedation medicines used during the test).    FOLLOW UP: Our staff will call the number listed on your records the next business day following your procedure.  We will call around 7:15- 8:00 am to check on you and address any questions or concerns that you may have regarding the information given to you following your procedure. If we do not reach you, we will leave a message.     If any biopsies were taken you will be contacted by phone or by letter within the next 1-3 weeks.  Please call us at [phone] if you have not heard about the biopsies in 3 weeks.    SIGNATURES/CONFIDENTIALITY: You and/or your care partner have signed paperwork which will be entered into your electronic medical record.  These signatures attest to the fact that that the information above on your After Visit Summary has been reviewed and is understood.  Full responsibility of the confidentiality of this discharge information lies with you and/or your care-partner. 

## 2022-10-03 NOTE — Progress Notes (Signed)
Called to room to assist during endoscopic procedure.  Patient ID and intended procedure confirmed with present staff. Received instructions for my participation in the procedure from the performing physician.  

## 2022-10-03 NOTE — Op Note (Signed)
[facility] Patient Name: Brenda Wyatt Procedure Date: 10/03/2022 11:46 AM MRN: [id number] Endoscopist: Mauri Pole , MD, [id number] Age: 71 Referring MD:  Date of Birth: 1951-07-06 Gender: Female Account #: [id number] Procedure:                Colonoscopy Indications:              Screening for colorectal malignant neoplasm Medicines:                Monitored Anesthesia Care Procedure:                Pre-Anesthesia Assessment:                           - Prior to the procedure, a History and Physical                            was performed, and patient medications and                            allergies were reviewed. The patient's tolerance of                            previous anesthesia was also reviewed. The risks                            and benefits of the procedure and the sedation                            options and risks were discussed with the patient.                            All questions were answered, and informed consent                            was obtained. Prior Anticoagulants: The patient has                            taken no anticoagulant or antiplatelet agents. ASA                            Grade Assessment: II - A patient with mild systemic                            disease. After reviewing the risks and benefits,                            the patient was deemed in satisfactory condition to                            undergo the procedure.                           After obtaining informed consent, the colonoscope  was passed under direct vision. Throughout the                            procedure, the patient's blood pressure, pulse, and                            oxygen saturations were monitored continuously. The                            PCF-HQ190L Colonoscope was introduced through the                            anus and advanced to the the cecum, identified by                             appendiceal orifice and ileocecal valve. The                            colonoscopy was performed without difficulty. The                            patient tolerated the procedure well. The quality                            of the bowel preparation was good. The ileocecal                            valve, appendiceal orifice, and rectum were                            photographed. Scope In: 11:49:24 AM Scope Out: 12:02:43 PM Scope Withdrawal Time: 0 hours 8 minutes 58 seconds  Total Procedure Duration: 0 hours 13 minutes 19 seconds  Findings:                 The perianal and digital rectal examinations were                            normal.                           A 1 mm polyp was found in the ascending colon. The                            polyp was sessile. The polyp was removed with a                            cold biopsy forceps. Resection and retrieval were                            complete.                           Scattered small-mouthed diverticula were found in  the sigmoid colon, descending colon, transverse                            colon, ascending colon and cecum.                           Non-bleeding external and internal hemorrhoids were                            found during retroflexion. The hemorrhoids were                            medium-sized. Complications:            No immediate complications. Estimated Blood Loss:     Estimated blood loss was minimal. Impression:               - One 1 mm polyp in the ascending colon, removed                            with a cold biopsy forceps. Resected and retrieved.                           - Diverticulosis in the sigmoid colon, in the                            descending colon, in the transverse colon, in the                            ascending colon and in the cecum.                           - Non-bleeding external and internal hemorrhoids. Recommendation:           - Patient  has a contact number available for                            emergencies. The signs and symptoms of potential                            delayed complications were discussed with the                            patient. Return to normal activities tomorrow.                            Written discharge instructions were provided to the                            patient.                           - Resume previous diet.                           - Continue present medications.                           -  Await pathology results.                           - Repeat colonoscopy in 5-10 years for surveillance                            based on pathology results. Mauri Pole, MD 10/03/2022 12:07:40 PM This report has been signed electronically.

## 2022-10-03 NOTE — Progress Notes (Signed)
VS completed by DT.   Pt's states no medical or surgical changes since previsit or office visit.   Interpreter used today at the [facility] for this pt.  Interpreter's name is- Database administrator

## 2022-10-03 NOTE — Progress Notes (Signed)
[facility] History and Physical   Primary Care Physician:  Donney Dice, DO   Reason for Procedure:  Colorectal cancer screening  Plan:    Screening colonoscopy with possible interventions as needed     HPI: Brenda Wyatt is a very pleasant 71 y.o. female here for screening colonoscopy. Denies any nausea, vomiting, abdominal pain, melena or bright red blood per rectum  The risks and benefits as well as alternatives of endoscopic procedure(s) have been discussed and reviewed. All questions answered. The patient agrees to proceed.    Past Medical History:  Diagnosis Date   Anxiety    Hypertension     Past Surgical History:  Procedure Laterality Date   CESAREAN SECTION      Prior to Admission medications   Medication Sig Start Date End Date Taking? Authorizing Provider  amLODipine-valsartan (EXFORGE) 5-160 MG tablet Take 1 tablet by mouth daily. 09/08/22 01/06/23 Yes Darci Current, DO  atorvastatin (LIPITOR) 10 MG tablet Take 1 tablet (10 mg total) by mouth daily. 08/08/22  Yes Ganta, Anupa, DO  meloxicam (MOBIC) 15 MG tablet Take 1 tablet (15 mg total) by mouth daily. 10/02/22  Yes Sharion Settler, DO  omeprazole (PRILOSEC) 20 MG capsule Take 1 capsule (20 mg total) by mouth daily. 12/22/20  Yes Ganta, Anupa, DO  diclofenac Sodium (VOLTAREN) 1 % GEL Apply 4 g topically 4 (four) times daily. 09/12/22   Salvadore Oxford, MD  fexofenadine (ALLEGRA) 180 MG tablet Take 1 tablet (180 mg total) by mouth daily. Patient not taking: Reported on 09/19/2022 09/12/22   Salvadore Oxford, MD  triamcinolone ointment (KENALOG) 0.5 % Apply 1 Application topically 2 (two) times daily. 09/07/22   Darci Current, DO    Current Outpatient Medications  Medication Sig Dispense Refill   amLODipine-valsartan (EXFORGE) 5-160 MG tablet Take 1 tablet by mouth daily. 60 tablet 1   atorvastatin (LIPITOR) 10 MG tablet Take 1 tablet (10 mg total) by mouth daily. 90 tablet 3    meloxicam (MOBIC) 15 MG tablet Take 1 tablet (15 mg total) by mouth daily. 30 tablet 0   omeprazole (PRILOSEC) 20 MG capsule Take 1 capsule (20 mg total) by mouth daily. 30 capsule 0   diclofenac Sodium (VOLTAREN) 1 % GEL Apply 4 g topically 4 (four) times daily. 150 g 0   fexofenadine (ALLEGRA) 180 MG tablet Take 1 tablet (180 mg total) by mouth daily. (Patient not taking: Reported on 09/19/2022) 90 tablet 0   triamcinolone ointment (KENALOG) 0.5 % Apply 1 Application topically 2 (two) times daily. 30 g 0   Current Facility-Administered Medications  Medication Dose Route Frequency Provider Last Rate Last Admin   0.9 %  sodium chloride infusion  500 mL Intravenous Once Mauri Pole, MD        Allergies as of 10/03/2022 - Review Complete 10/03/2022  Allergen Reaction Noted   Ace inhibitors  05/31/2009   Penicillins  05/27/2013    Family History  Problem Relation Age of Onset   Colon cancer Neg Hx    Esophageal cancer Neg Hx    Rectal cancer Neg Hx    Stomach cancer Neg Hx     Social History   Socioeconomic History   Marital status: Unknown    Spouse name: Not on file   Number of children: Not on file   Years of education: Not on file   Highest education level: Not on file  Occupational History   Not on file  Tobacco Use   Smoking status:  Never    Passive exposure: Never   Smokeless tobacco: Never  Vaping Use   Vaping Use: Never used  Substance and Sexual Activity   Alcohol use: Never   Drug use: Never   Sexual activity: Not on file  Other Topics Concern   Not on file  Social History Narrative   Not on file   Social Determinants of Health   Financial Resource Strain: Not on file  Food Insecurity: Not on file  Transportation Needs: Not on file  Physical Activity: Not on file  Stress: Not on file  Social Connections: Not on file  Intimate Partner Violence: Not on file    Review of Systems:  All other review of systems negative except as mentioned in  the HPI.  Physical Exam: Vital signs in last 24 hours: Blood Pressure (Abnormal) 159/63   Pulse 79   Temperature (Abnormal) 97.1 F (36.2 C) (Temporal)   Height '4\' 11"'$  (1.499 m)   Weight 199 lb (90.3 kg)   Oxygen Saturation 97%   Body Mass Index 40.19 kg/m  General:   Alert, NAD Lungs:  Clear .   Heart:  Regular rate and rhythm Abdomen:  Soft, nontender and nondistended. Neuro/Psych:  Alert and cooperative. Normal mood and affect. A and O x 3  Reviewed labs, radiology imaging, old records and pertinent past GI work up  Patient is appropriate for planned procedure(s) and anesthesia in an ambulatory setting   K. Denzil Magnuson , MD [phone]

## 2022-10-04 ENCOUNTER — Telehealth: Payer: Self-pay | Admitting: *Deleted

## 2022-10-04 NOTE — Telephone Encounter (Signed)
  Follow up Call-     10/03/2022   10:52 AM  Call back number  Post procedure Call Back phone  # [phone]  Permission to leave phone message Yes     Patient questions:  Do you have a fever, pain , or abdominal swelling? No. Pain Score  0 *  Have you tolerated food without any problems? Yes.    Have you been able to return to your normal activities? Yes.    Do you have any questions about your discharge instructions: Diet   No. Medications  No. Follow up visit  No.  Do you have questions or concerns about your Care? No.  Actions: * If pain score is 4 or above: No action needed, pain <4.

## 2022-10-06 ENCOUNTER — Ambulatory Visit: Payer: Self-pay

## 2022-10-06 ENCOUNTER — Ambulatory Visit (INDEPENDENT_AMBULATORY_CARE_PROVIDER_SITE_OTHER): Payer: Medicare Other | Admitting: Surgery

## 2022-10-06 ENCOUNTER — Encounter: Payer: Self-pay | Admitting: Surgery

## 2022-10-06 DIAGNOSIS — M25561 Pain in right knee: Secondary | ICD-10-CM | POA: Diagnosis not present

## 2022-10-06 DIAGNOSIS — G8929 Other chronic pain: Secondary | ICD-10-CM | POA: Diagnosis not present

## 2022-10-06 DIAGNOSIS — M1711 Unilateral primary osteoarthritis, right knee: Secondary | ICD-10-CM

## 2022-10-06 DIAGNOSIS — M1712 Unilateral primary osteoarthritis, left knee: Secondary | ICD-10-CM | POA: Diagnosis not present

## 2022-10-08 ENCOUNTER — Encounter: Payer: Self-pay | Admitting: Gastroenterology

## 2022-10-12 ENCOUNTER — Ambulatory Visit: Payer: Medicare Other | Attending: Family Medicine

## 2022-10-16 ENCOUNTER — Ambulatory Visit (INDEPENDENT_AMBULATORY_CARE_PROVIDER_SITE_OTHER): Payer: Medicare Other | Admitting: Family Medicine

## 2022-10-16 ENCOUNTER — Encounter: Payer: Self-pay | Admitting: Family Medicine

## 2022-10-16 VITALS — BP 153/70 | HR 80 | Wt 201.2 lb

## 2022-10-16 DIAGNOSIS — R0609 Other forms of dyspnea: Secondary | ICD-10-CM

## 2022-10-16 NOTE — Progress Notes (Signed)
  Patient Name: Brenda Wyatt Date of Birth: January 19, 1951 Date of Visit: 10/16/22 PCP: Donney Dice, DO  Chief Complaint: preoperative evaluation Surgeon:  supposed to meet on 27th Surgery: knee replacements Date of Procedure: to be determined  Subjective: Brenda Wyatt is a pleasant 71 y.o. with medical history significant for HTN,  presenting today for surgical risk assessment.    The patient can walk several city blocks and or up 2 flights of stairs without difficulty.   Prior surgeries: Cesarean section, endoscopies,   Review of systems: Chest pain with exertion? No Dyspnea on exertion? Yes Can you walk 2 blocks without dyspnea or chest pain? Yes Can you walk up a flight of stairs without chest pain or dyspnea? Yes Any history of easy bruising or bleeding? No Any family history of bleeding diathesis? No Any personal or family of difficulty with anesthesia? No Any history of sleep apnea? No  Medical History: Cardiac conditions? no History of VTE? no History of adverse surgical outcome? no  Diabetes? no Obesity? yes OSA? no   I have reviewed the patient's medical, surgical, family, and social history as appropriate.   Vitals:   10/16/22 1459  BP: (!) 153/70  Pulse: 80  SpO2: 100%   Filed Weights   10/16/22 1459  Weight: 201 lb 4 oz (91.3 kg)   General: NAD  Cardiovascular: RRR, murmur appreciated, trace peripheral edema bilaterally Respiratory: normal WOB on RA, CTAB, no wheezes, ronchi or rales Extremities: Moving all 4 extremities equally   Diagnoses and all orders for this visit:  Dyspnea on exertion -     ECHOCARDIOGRAM COMPLETE; Future    Patient is low risk for a medium risk surgery.    This patient has new dyspnea on exertion over the past year, with history of cardiac murmur, and lower extremity edema.  She is short of breath after walking for several minutes or several flights of stairs, likely still able to achieve 4 METS.  However reasonable to evaluate cardiac function prior to surgery.  ECHO ordered.  Will optimize medically if necessary depending on ECHO results for possible decreased cardiac function. -Pending echo results, follow-up in clinic     Salvadore Oxford, MD, PGY-1 [facility] 3:49 PM 10/16/2022

## 2022-10-16 NOTE — Patient Instructions (Signed)
It was great to see you! Thank you for allowing me to participate in your care!  I recommend that you always bring your medications to each appointment as this makes it easy to ensure we are on the correct medications and helps [place] not miss when refills are needed.  Our plans for today:  - I recommend reevaluating your heart before surgery with ECHO (ultrasound of your heart). They will call you schedule.   Take care and seek immediate care sooner if you develop any concerns.   Dr. Salvadore Oxford, MD [place]

## 2022-10-19 NOTE — Progress Notes (Signed)
Office Visit Note   Patient: Brenda Wyatt           Date of Birth: 10-May-1951           MRN: [id number] Visit Date: 10/06/2022              Requested by: Zenia Resides, MD [address] [place],  [place] [address] PCP: Donney Dice, DO   Assessment & Plan: Visit Diagnoses:  1. Chronic pain of right knee   2. Primary osteoarthritis of right knee   3. Primary osteoarthritis of left knee     Plan: Had long discussion with patient and family members present that I think best treatment option for her knees will be total knee replacements.  I really do not think conservative management with intra-articular Marcaine/Depo-Medrol injections would be of any long-term benefit.  Also discussed potential conservative measures with viscosupplementation.  Before I would inject knees today I would like her to follow-up with Dr. Alphonzo Severance to discuss her options.  Family member state that patient would like to proceed with scheduling surgery if that is recommendation from Dr. Marlou Sa.  All questions answered.  I did ask family members to get patient an appointment with primary care to get a medical clearance and patient will also possibly need a cardiac clearance.  I would like to have this done before appointment with Dr. Marlou Sa.  All questions answered.  Follow-Up Instructions: Return in about 3 weeks (around 10/27/2022) for WITH DR DEAN (PER Ginger Leeth) FOR SURGICAL CONSULTATION LEFT VS RIGHT TOTAL KNEE REPLACEMENTS.   Orders:  Orders Placed This Encounter  Procedures   XR KNEE 3 VIEW RIGHT   No orders of the defined types were placed in this encounter.     Procedures: No procedures performed   Clinical Data: No additional findings.   Subjective: Chief Complaint  Patient presents with   Right Knee - Pain    HPI 71 year old Hispanic female comes in with complaints of bilateral knee pain.  Referred by her primary care provider.  Patient had bilateral knee x-rays done  August 30, 2022 and that report showed:  Narrative & Impression  CLINICAL DATA:  Knee pain   EXAM: BILATERAL KNEES STANDING - 1 VIEW   COMPARISON:  None Available.   FINDINGS: Moderate severe left greater than right medial joint space degenerative change with narrowing. Sclerosis on the left. Mild compensatory widening of the left lateral joint space with spurring. Moderate degenerative spurring lateral joint space on the right.   IMPRESSION: Moderate severe bilateral knee arthritis worst involving the medial joint spaces     Electronically Signed   By: Donavan Foil M.D.   On: 08/31/2022 16:29    Patient being seen with the help of interpreter.  Family members present.  Patient has bilateral knee pain for about a year but states that the right knee has been also getting worse the last 6 months.  No injury.  Has taken Tylenol without any relief.  Pain aggravated when she is up ambulating, squatting, getting up from a seated position.  Decreased activity level due to ongoing and worsening knee issues. Review of Systems No current complaints of cardiopulmonary GI/GU issues  Objective: Vital Signs: There were no vitals taken for this visit.  Physical Exam HENT:     Head: Normocephalic and atraumatic.     Nose: Nose normal.  Eyes:     Extraocular Movements: Extraocular movements intact.  Pulmonary:     Effort: No respiratory distress.  Abdominal:     General: There is no distension.  Musculoskeletal:     Comments: Exam bilateral knees she does have some swelling with small effusions.  Positive bilateral patellofemoral crepitus.  Medial lateral joint line tenderness.  Bilateral calves nontender.  Neurological:     Mental Status: She is alert.  Psychiatric:        Mood and Affect: Mood normal.     Ortho Exam  Specialty Comments:  No specialty comments available.  Imaging: No results found.   PMFS History: Patient Active Problem List   Diagnosis Date Noted    Osteoarthritis of both knees 10/02/2022   Healthcare maintenance 09/07/2022   Pruritus 09/07/2022   Chronic pain of right knee 08/31/2022   Sciatica of right side 04/17/2022   GERD without esophagitis 12/23/2020   Anxiety 12/23/2020   Hypercholesteremia 12/23/2020   Lower extremity edema 07/17/2014   Polyuria 07/17/2014   Acanthosis nigricans 07/17/2014   ENDOGENOUS OBESITY 05/31/2009   CONTACT DERMATITIS&OTHER ECZEMA DUE UNSPEC CAUSE 05/31/2009   DEPRESSION 04/26/2009   Essential hypertension 04/26/2009   GASTRITIS 04/26/2009   CARDIAC MURMUR 04/26/2009   COUGH 04/26/2009   Past Medical History:  Diagnosis Date   Anxiety    Hypertension     Family History  Problem Relation Age of Onset   Colon cancer Neg Hx    Esophageal cancer Neg Hx    Rectal cancer Neg Hx    Stomach cancer Neg Hx     Past Surgical History:  Procedure Laterality Date   CESAREAN SECTION     Social History   Occupational History   Not on file  Tobacco Use   Smoking status: Never    Passive exposure: Never   Smokeless tobacco: Never  Vaping Use   Vaping Use: Never used  Substance and Sexual Activity   Alcohol use: Never   Drug use: Never   Sexual activity: Not on file

## 2022-10-24 ENCOUNTER — Ambulatory Visit (HOSPITAL_COMMUNITY)
Admission: RE | Admit: 2022-10-24 | Discharge: 2022-10-24 | Disposition: A | Discharge status: Home / Self Care | Payer: Medicare Other | Source: Ambulatory Visit | Attending: Family Medicine | Admitting: Family Medicine

## 2022-10-24 DIAGNOSIS — R0600 Dyspnea, unspecified: Secondary | ICD-10-CM | POA: Diagnosis not present

## 2022-10-24 DIAGNOSIS — I358 Other nonrheumatic aortic valve disorders: Secondary | ICD-10-CM | POA: Insufficient documentation

## 2022-10-24 DIAGNOSIS — I10 Essential (primary) hypertension: Secondary | ICD-10-CM | POA: Insufficient documentation

## 2022-10-24 DIAGNOSIS — R0609 Other forms of dyspnea: Secondary | ICD-10-CM

## 2022-10-25 LAB — ECHOCARDIOGRAM COMPLETE
Area-P 1/2: 3.31 cm2
S' Lateral: 2.8 cm

## 2022-10-30 ENCOUNTER — Ambulatory Visit (INDEPENDENT_AMBULATORY_CARE_PROVIDER_SITE_OTHER): Payer: Medicare Other | Admitting: Orthopedic Surgery

## 2022-10-30 ENCOUNTER — Ambulatory Visit (INDEPENDENT_AMBULATORY_CARE_PROVIDER_SITE_OTHER): Payer: Medicare Other

## 2022-10-30 ENCOUNTER — Encounter: Payer: Self-pay | Admitting: Orthopedic Surgery

## 2022-10-30 DIAGNOSIS — M545 Low back pain, unspecified: Secondary | ICD-10-CM

## 2022-10-31 ENCOUNTER — Encounter: Payer: Self-pay | Admitting: Orthopedic Surgery

## 2022-10-31 NOTE — Progress Notes (Unsigned)
Office Visit Note   Patient: Brenda Wyatt           Date of Birth: 1951-10-17           MRN: [id number] Visit Date: 10/30/2022 Requested by: Donney Dice, DO [address] [place],  [place] [address] PCP: Donney Dice, DO  Subjective: Chief Complaint  Patient presents with   Left Knee - Pain   Right Knee - Pain    HPI: Brenda Wyatt is a 71 y.o. female who presents to the office reporting right leg pain as well as some numbness and tingling in the right leg.  Patient is here with her daughter-in-law.  Patient states that it hurts her to walk.  She does report some pain radiating from her back down to the right leg with numbness when she walks.  Also describes bilateral knee pain right worse than left.  She only has 3 stairs in her home.  Has tried injections which have not been helpful.  Currently not working.  Pain does not wake her from sleep.  She does report diminished walking and standing endurance.  Does give a definite history of radiating pain from the back down to the leg on both sides but more on the right than the left..                ROS: All systems reviewed are negative as they relate to the chief complaint within the history of present illness.  Patient denies fevers or chills.  Assessment & Plan: Visit Diagnoses:  1. Low back pain, unspecified back pain laterality, unspecified chronicity, unspecified whether sciatica present     Plan: Impression is bilateral knee arthritis with the left knee radiographically worse than the right.  She has fairly reasonable range of motion but gives a history of spinal stenosis type symptoms.  Radiographs do show some L4-5 degenerative disc disease with mild spondylolisthesis.  Before embarking on possible knee replacement I would like to make sure that final stenosis is not also in play here.  Would likely still first do the knee replacement if needed but she is having a lot of whole leg symptoms on the right in  addition to her knee pain discretely.  Follow-Up Instructions: No follow-ups on file.   Orders:  Orders Placed This Encounter  Procedures   XR Lumbar Spine 2-3 Views   MR Lumbar Spine w/o contrast   No orders of the defined types were placed in this encounter.     Procedures: No procedures performed   Clinical Data: No additional findings.  Objective: Vital Signs: There were no vitals taken for this visit.  Physical Exam:  Constitutional: Patient appears well-developed HEENT:  Head: Normocephalic Eyes:EOM are normal Neck: Normal range of motion Cardiovascular: Normal rate Pulmonary/chest: Effort normal Neurologic: Patient is alert Skin: Skin is warm Psychiatric: Patient has normal mood and affect  Ortho Exam: Ortho exam demonstrates mild varus alignment bilaterally.  Nonantalgic gait.  No effusion in either knee.  Collateral and cruciate ligaments are stable.  No nerve root tension signs.  Pedal pulses palpable.  Ankle dorsiflexion intact.  Patient has 5 out of 5 ankle dorsiflexion plantarflexion quad hamstring and hip flexion strength.  Does report some paresthesias when she is walking which extends from the back and posterior buttock on the right leg down to her foot.  Has similar symptoms on the left but to a lesser degree.  Does have mild patellofemoral crepitus bilaterally.  Review of radiographs does demonstrate end-stage  arthritis in both knees.  Specialty Comments:  No specialty comments available.  Imaging: No results found.   PMFS History: Patient Active Problem List   Diagnosis Date Noted   Osteoarthritis of both knees 10/02/2022   Healthcare maintenance 09/07/2022   Pruritus 09/07/2022   Chronic pain of right knee 08/31/2022   Sciatica of right side 04/17/2022   GERD without esophagitis 12/23/2020   Anxiety 12/23/2020   Hypercholesteremia 12/23/2020   Lower extremity edema 07/17/2014   Polyuria 07/17/2014   Acanthosis nigricans 07/17/2014    ENDOGENOUS OBESITY 05/31/2009   CONTACT DERMATITIS&OTHER ECZEMA DUE UNSPEC CAUSE 05/31/2009   DEPRESSION 04/26/2009   Essential hypertension 04/26/2009   GASTRITIS 04/26/2009   CARDIAC MURMUR 04/26/2009   COUGH 04/26/2009   Past Medical History:  Diagnosis Date   Anxiety    Hypertension     Family History  Problem Relation Age of Onset   Colon cancer Neg Hx    Esophageal cancer Neg Hx    Rectal cancer Neg Hx    Stomach cancer Neg Hx     Past Surgical History:  Procedure Laterality Date   CESAREAN SECTION     Social History   Occupational History   Not on file  Tobacco Use   Smoking status: Never    Passive exposure: Never   Smokeless tobacco: Never  Vaping Use   Vaping Use: Never used  Substance and Sexual Activity   Alcohol use: Never   Drug use: Never   Sexual activity: Not on file

## 2022-11-15 ENCOUNTER — Other Ambulatory Visit: Payer: Medicare Other

## 2022-11-29 ENCOUNTER — Ambulatory Visit
Admission: RE | Admit: 2022-11-29 | Discharge: 2022-11-29 | Disposition: A | Discharge status: Home / Self Care | Payer: Medicare Other | Source: Ambulatory Visit | Attending: Orthopedic Surgery | Admitting: Orthopedic Surgery

## 2022-11-29 DIAGNOSIS — M545 Low back pain, unspecified: Secondary | ICD-10-CM | POA: Diagnosis not present

## 2022-11-29 DIAGNOSIS — M48061 Spinal stenosis, lumbar region without neurogenic claudication: Secondary | ICD-10-CM | POA: Diagnosis not present

## 2022-12-25 ENCOUNTER — Ambulatory Visit: Payer: Medicare Other | Admitting: Orthopedic Surgery

## 2022-12-27 ENCOUNTER — Other Ambulatory Visit: Payer: Self-pay | Admitting: Family Medicine

## 2022-12-29 ENCOUNTER — Ambulatory Visit (INDEPENDENT_AMBULATORY_CARE_PROVIDER_SITE_OTHER): Payer: Medicare Other | Admitting: Family Medicine

## 2022-12-29 ENCOUNTER — Encounter: Payer: Self-pay | Admitting: Family Medicine

## 2022-12-29 VITALS — BP 136/59 | HR 84 | Ht 59.0 in | Wt 204.1 lb

## 2022-12-29 DIAGNOSIS — R1011 Right upper quadrant pain: Secondary | ICD-10-CM

## 2022-12-29 DIAGNOSIS — L299 Pruritus, unspecified: Secondary | ICD-10-CM | POA: Diagnosis not present

## 2022-12-29 DIAGNOSIS — M170 Bilateral primary osteoarthritis of knee: Secondary | ICD-10-CM | POA: Diagnosis not present

## 2022-12-29 MED ORDER — MELOXICAM 15 MG PO TABS: ORAL_TABLET | ORAL | 0 refills | Status: DC

## 2022-12-29 NOTE — Patient Instructions (Addendum)
Fue maravilloso verte hoy.  Por favor traiga TODOS sus medicamentos a cada visita.  Hoy hablamos de:  Tu piel est seca. Yo probara una crema como Cetaphil o CeraVe. Utilice esto a diario.  Puede probar benadryl tpico sobre su abdomen para ver si le ayuda. No tomara ningn benadryl oral.  El lunes, regrese para hacerse anlisis de laboratorio solo para revisar su hgado y vescula biliar.  Gracias por venir a su visita segn lo programado. ltimamente hemos tenido un gran problema de "ausencias" y esto limita significativamente nuestra capacidad para atender y Clinical biochemist a los pacientes. Como recordatorio amistoso: si no puede asistir a su cita, llame para cancelarla. Tenemos una poltica de no presentacin para aquellos que no cancelan dentro de las 24 horas. Nuestra poltica es que si falta o no cancela una cita dentro de las 24 horas, 3 veces en un perodo de 6 meses, es posible que lo despidan de [place].  [place] Pert por elegir Medicina familiar [place].  Llame al [phone] si tiene alguna pregunta sobre la cita de [place].  Asegrese de programar un seguimiento en la recepcin antes de irse hoy.  Sharion Settler, D.O. PGY-3 [facility]amiliar

## 2022-12-29 NOTE — Progress Notes (Signed)
    SUBJECTIVE:   CHIEF COMPLAINT / HPI:   Brenda Wyatt is a 72 y.o. female who presents to the [facility] clinic today to discuss the following concerns:   Abdominal Pain, abdominal itching  Started coughing about 8 days ago. She then had some abdominal soreness from coughing. Since then she has felt some abdominal pressure and generalized itchiness on her abdomen. She tried to put some vaseline on her abdomen because she saw that her skin was dry. The cough has resolved. She denies any nausea or vomiting. She doesn't eat normally. The discomfort in her abdomen is most localized to her RUQ. She feels the pain is worse after eating.   PERTINENT  PMH / PSH: HTN, GERD without esophagitis   OBJECTIVE:   BP (!) 155/72   Pulse 84   Ht '4\' 11"'$  (1.499 m)   Wt 204 lb 2 oz (92.6 kg)   SpO2 100%   BMI 41.23 kg/m    General: NAD, pleasant, able to participate in exam Respiratory: normal effort Abdomen: Bowel sounds present, obese, ttp to RUQ without R/G. Non-distended, soft  Skin: warm and dry, no rashes noted but skin on abdomen was dry  Psych: Normal affect and mood  ASSESSMENT/PLAN:   1. RUQ abdominal pain No signs of peritonitis on examination.  Does have some mild tenderness to palpation to the right upper quadrant.  Not dehydrated.  No nausea/vomiting. Unable to collect labs in office today due to lab closure, have set her up for a lab appointment on Monday. If liver enzymes or bili elevated will consider further eval with RUQ ultrasound. Differential pancreatitis vs cholecystitis vs cholelithiasis vs GERD.  - Comprehensive metabolic panel; Future - Lipase; Future - CBC; Future  2. Itchy skin Dry skin noted on exam, this could be the reason for her pruritus. No rash noted.  -Encouraged skin hydration -PRN topical benadryl   3. Osteoarthritis of both knees, unspecified osteoarthritis type Requesting refill of her meloxicam. She had complete relief of her knee pain with the  meloxicam. She is scheduled for knee arthroplasty next month. Would like refill to get her through her discomfort in the meantime. -Refilled, advised to take with food.  -Advised to use PRN, try to avoid daily use if possible  -Reviewed last BMP, renal function adequate    Sharion Settler, DO [place]

## 2022-12-29 NOTE — Assessment & Plan Note (Signed)
Requesting refill of her meloxicam. She had complete relief of her knee pain with the meloxicam. She is scheduled for knee arthroplasty next month. Would like refill to get her through her discomfort in the meantime. -Refilled, advised to take with food.  -Advised to use PRN, try to avoid daily use if possible  -Reviewed last BMP, renal function adequate

## 2023-01-01 ENCOUNTER — Other Ambulatory Visit: Payer: Medicare Other

## 2023-01-01 DIAGNOSIS — R1011 Right upper quadrant pain: Secondary | ICD-10-CM | POA: Diagnosis not present

## 2023-01-02 LAB — COMPREHENSIVE METABOLIC PANEL
ALT: 12 IU/L (ref 0–32)
AST: 18 IU/L (ref 0–40)
Albumin/Globulin Ratio: 1.3 (ref 1.2–2.2)
Albumin: 4 g/dL (ref 3.9–4.9)
Alkaline Phosphatase: 108 IU/L (ref 44–121)
BUN/Creatinine Ratio: 32 — ABNORMAL HIGH (ref 12–28)
BUN: 24 mg/dL (ref 8–27)
Bilirubin Total: 0.5 mg/dL (ref 0.0–1.2)
CO2: 24 mmol/L (ref 20–29)
Calcium: 8.9 mg/dL (ref 8.7–10.3)
Chloride: 102 mmol/L (ref 96–106)
Creatinine, Ser: 0.76 mg/dL (ref 0.57–1.00)
Globulin, Total: 3.1 g/dL (ref 1.5–4.5)
Glucose: 105 mg/dL — ABNORMAL HIGH (ref 70–99)
Potassium: 4.1 mmol/L (ref 3.5–5.2)
Sodium: 139 mmol/L (ref 134–144)
Total Protein: 7.1 g/dL (ref 6.0–8.5)
eGFR: 84 mL/min/{1.73_m2} (ref >59)

## 2023-01-02 LAB — CBC
Hematocrit: 38.1 % (ref 34.0–46.6)
Hemoglobin: 12.4 g/dL (ref 11.1–15.9)
MCH: 30.8 pg (ref 26.6–33.0)
MCHC: 32.5 g/dL (ref 31.5–35.7)
MCV: 95 fL (ref 79–97)
Platelets: 214 10*3/uL (ref 150–450)
RBC: 4.03 x10E6/uL (ref 3.77–5.28)
RDW: 12.7 % (ref 11.7–15.4)
WBC: 6.1 10*3/uL (ref 3.4–10.8)

## 2023-01-02 LAB — LIPASE: Lipase: 32 U/L (ref 14–72)

## 2023-01-15 ENCOUNTER — Encounter: Payer: Self-pay | Admitting: Orthopedic Surgery

## 2023-01-15 ENCOUNTER — Ambulatory Visit (INDEPENDENT_AMBULATORY_CARE_PROVIDER_SITE_OTHER): Payer: Medicare Other | Admitting: Orthopedic Surgery

## 2023-01-15 DIAGNOSIS — M545 Low back pain, unspecified: Secondary | ICD-10-CM | POA: Diagnosis not present

## 2023-01-15 NOTE — Progress Notes (Signed)
Office Visit Note   Patient: Brenda Wyatt           Date of Birth: 1950/12/14           MRN: [id number] Visit Date: 01/15/2023 Requested by: Donney Dice, DO [place],  [place] [id number] PCP: Donney Dice, DO  Subjective: Chief Complaint  Patient presents with   Other     Scan review    HPI: Brenda Wyatt is a 72 y.o. female who presents to the office reporting low back pain.  Since she was last seen she has had an MRI scan of the lumbar spine.  She states she has right greater than left-sided symptoms.  Some days are okay.  MRI scan is reviewed with the patient and it does show a left-sided L4 nerve root foraminal impingement.  Patient does not take blood thinners.  Also reports knee arthritis symptoms..                ROS: All systems reviewed are negative as they relate to the chief complaint within the history of present illness.  Patient denies fevers or chills.  Assessment & Plan: Visit Diagnoses:  1. Low back pain, unspecified back pain laterality, unspecified chronicity, unspecified whether sciatica present     Plan: Impression is foraminal stenosis but no definite operative problem in the lumbar spine based on clinical presentation as well as MRI findings.  I think she would do well with epidural steroid injections with Dr. Ernestina Patches after those injections we could consider injecting the knee.  Follow-up as needed.  Follow-Up Instructions: No follow-ups on file.   Orders:  Orders Placed This Encounter  Procedures   Ambulatory referral to Physical Medicine Rehab   No orders of the defined types were placed in this encounter.     Procedures: No procedures performed   Clinical Data: No additional findings.  Objective: Vital Signs: There were no vitals taken for this visit.  Physical Exam:  Constitutional: Patient appears well-developed HEENT:  Head: Normocephalic Eyes:EOM are normal Neck: Normal range of  motion Cardiovascular: Normal rate Pulmonary/chest: Effort normal Neurologic: Patient is alert Skin: Skin is warm Psychiatric: Patient has normal mood and affect  Ortho Exam: Ortho exam demonstrates 5 out of 5 ankle dorsiflexion plantarflexion quad and hamstring strength.  Very good hip flexion strength bilaterally with no nerve root tension signs.  Mild knee pain to direct palpation.  Extensor mechanism intact bilaterally.  Pedal pulses intact.  Varus alignment present.  Specialty Comments:  No specialty comments available.  Imaging: No results found.   PMFS History: Patient Active Problem List   Diagnosis Date Noted   Osteoarthritis of both knees 10/02/2022   Healthcare maintenance 09/07/2022   Pruritus 09/07/2022   Chronic pain of right knee 08/31/2022   Sciatica of right side 04/17/2022   GERD without esophagitis 12/23/2020   Anxiety 12/23/2020   Hypercholesteremia 12/23/2020   Lower extremity edema 07/17/2014   Polyuria 07/17/2014   Acanthosis nigricans 07/17/2014   ENDOGENOUS OBESITY 05/31/2009   CONTACT DERMATITIS&OTHER ECZEMA DUE UNSPEC CAUSE 05/31/2009   DEPRESSION 04/26/2009   Essential hypertension 04/26/2009   GASTRITIS 04/26/2009   CARDIAC MURMUR 04/26/2009   COUGH 04/26/2009   Past Medical History:  Diagnosis Date   Anxiety    Hypertension     Family History  Problem Relation Age of Onset   Colon cancer Neg Hx    Esophageal cancer Neg Hx    Rectal cancer Neg Hx    Stomach  cancer Neg Hx     Past Surgical History:  Procedure Laterality Date   CESAREAN SECTION     Social History   Occupational History   Not on file  Tobacco Use   Smoking status: Never    Passive exposure: Never   Smokeless tobacco: Never  Vaping Use   Vaping Use: Never used  Substance and Sexual Activity   Alcohol use: Never   Drug use: Never   Sexual activity: Not on file

## 2023-01-16 DIAGNOSIS — L03032 Cellulitis of left toe: Secondary | ICD-10-CM | POA: Diagnosis not present

## 2023-01-16 DIAGNOSIS — L03031 Cellulitis of right toe: Secondary | ICD-10-CM | POA: Diagnosis not present

## 2023-01-16 DIAGNOSIS — L600 Ingrowing nail: Secondary | ICD-10-CM | POA: Diagnosis not present

## 2023-01-22 ENCOUNTER — Ambulatory Visit (INDEPENDENT_AMBULATORY_CARE_PROVIDER_SITE_OTHER): Payer: Medicare Other | Admitting: Physical Medicine and Rehabilitation

## 2023-01-22 ENCOUNTER — Encounter: Payer: Self-pay | Admitting: Physical Medicine and Rehabilitation

## 2023-01-22 DIAGNOSIS — M5416 Radiculopathy, lumbar region: Secondary | ICD-10-CM | POA: Diagnosis not present

## 2023-01-22 DIAGNOSIS — M47816 Spondylosis without myelopathy or radiculopathy, lumbar region: Secondary | ICD-10-CM | POA: Diagnosis not present

## 2023-01-22 DIAGNOSIS — M5441 Lumbago with sciatica, right side: Secondary | ICD-10-CM

## 2023-01-22 DIAGNOSIS — G8929 Other chronic pain: Secondary | ICD-10-CM

## 2023-01-22 NOTE — Progress Notes (Unsigned)
Functional Pain Scale - descriptive words and definitions  Distracting (5)    Aware of pain/able to complete some ADL's but limited by pain/sleep is affected and active distractions are only slightly useful. Moderate range order  Average Pain  varies  Lower back pain on both sides, but worse on right side. Radiation in right leg to the knee

## 2023-01-22 NOTE — Progress Notes (Unsigned)
Brenda Wyatt - 72 y.o. female MRN [id number]  Date of birth: 01-18-1951  Office Visit Note: Visit Date: 01/22/2023 PCP: Donney Dice, DO Referred by: Donney Dice, DO  Subjective: Chief Complaint  Patient presents with   Lower Back - Pain   HPI: Brenda Wyatt is a 72 y.o. female who comes in today per the request of Dr. Alphonzo Severance for evaluation of chronic, worsening and severe bilateral lower back pain radiating down right buttock, hip and anterolateral leg to foot. Also reports severe chronic pain to bilateral knees. Daughter in law and interpreter at bedside. Pain ongoing for 2-3 years and worsens with walking. She describes pain as sore and aching, currently rates as 8 out of 10. She reports difficulty walking up the stairs in her home. Some relief of pain with home exercise regimen, rest and use of medications. Does take Tylenol and Meloxicam as needed. 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 No high grade spinal canal stenosis noted. No history of lumbar surgery/injections. She was recently evaluated by Dr. Marlou Sa for bilateral knee pain. History of knee injections with minimal relief of pain. Patient denies focal weakness, numbness and tingling.  No recent trauma or falls.   Review of Systems  Musculoskeletal:  Positive for back pain.  Neurological:  Negative for tingling, sensory change, focal weakness and weakness.  All other systems reviewed and are negative.  Otherwise per HPI.  Assessment & Plan: Visit Diagnoses:    ICD-10-CM   1. Chronic bilateral low back pain with right-sided sciatica  M54.41 Ambulatory referral to Physical Medicine Rehab   G89.29     2. Lumbar radiculopathy  M54.16 Ambulatory referral to Physical Medicine Rehab    3. Facet arthropathy, lumbar  M47.816 Ambulatory referral to Physical Medicine Rehab       Plan: Findings:   Chronic, worsening and severe bilateral lower back pain radiating down right buttock, hip and anterolateral leg to foot.  Patient continues to have severe pain despite good conservative therapy such as home exercise regimen, rest and use of medications.  Patient's clinical presentation and exam are are complex as her symptoms do not directly correlate with recent lumbar MRI findings.  Her pain pattern is consistent with L5 distribution.  Recent lumbar MRI imaging does exhibit degenerative changes consistent with her age and up to severe degenerative foraminal stenosis at the left L4 level. Next step is to perform diagnostic and hopefully therapeutic right L5-S1 interlaminar epidural steroid injection under fluoroscopic guidance.  She is not currently taking anticoagulant medication.  If good relief of pain with injection we can repeat this procedure infrequently as needed.  I did discuss lumbar epidural steroid injection in detail with patient and daughter-in-law today.  She was particularly concerned that this injection could leave her paralyzed.  I was able to reassure patient that this injection is performed safely using fluoroscopic guidance.  She has no further questions at this time.  If her pain persists postinjection we would consider regimen formal physical therapy, we can also look at medication management.  I instructed her to continue with the Tylenol and meloxicam as needed.  I also encouraged her to remain active as tolerated.  No red flag symptoms noted upon exam today.    Meds & Orders: No orders of the defined types were placed in this encounter.   Orders Placed This Encounter  Procedures   Ambulatory referral to Physical  Medicine Rehab    Follow-up: Return for Right L5-S1 interlaminar epidural steroid injection.   Procedures: No procedures performed      Clinical History: MRI LUMBAR SPINE WITHOUT CONTRAST   TECHNIQUE: Multiplanar, multisequence MR imaging of the lumbar spine  was performed. No intravenous contrast was administered.   COMPARISON:  Lumbar radiographs 10/30/2022.   FINDINGS: Segmentation:  Normal on the comparison.   Alignment: Stable and relatively normal lumbar lordosis. There is subtle anterolisthesis at L3-L4 and L4-L5.   Vertebrae: Some chronic degenerative endplate marrow signal changes. No marrow edema or evidence of acute osseous abnormality. Normal background bone marrow signal. Intact visible sacrum.   Conus medullaris and cauda equina: Conus extends to the L1 level. No lower spinal cord or conus signal abnormality.   Paraspinal and other soft tissues: Negative.   Disc levels:   T11-T12: Negative.   T12-L1: Tiny central disc protrusion (series 9, image 5). Mild facet hypertrophy. No stenosis.   L1-L2: Similar mild disc bulging and lobulated central disc protrusion. No stenosis.   L2-L3: Mild disc bulging and similar small central disc bulge or protrusion. Mild facet hypertrophy greater on the left. No spinal stenosis. Mild left lateral recess stenosis (left L3 nerve level). Mild left L2 foraminal stenosis.   L3-L4: Circumferential disc bulge, bulky far lateral components greater on the left. Mild facet hypertrophy. No spinal stenosis. No convincing lateral recess stenosis. Mild bilateral L3 foraminal stenosis.   L4-L5: Circumferential disc bulge with some endplate spurring. Mild facet and ligament flavum hypertrophy. No spinal or convincing lateral recess stenosis. Moderate to severe left and mild right L4 neural foraminal stenosis.   L5-S1: Mild far lateral disc bulging and endplate spurring. Mild facet hypertrophy. No stenosis.   IMPRESSION: 1. No acute osseous abnormality and generally mild for age lumbar spine degeneration. No lumbar spinal stenosis. 2. 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 Otherwise mild neural foraminal stenosis (right L3 and L4 nerve  levels).     Electronically Signed   By: Genevie Ann M.D.   On: 12/01/2022 10:27   She reports that she has never smoked. She has never been exposed to tobacco smoke. She has never used smokeless tobacco.  Recent Labs    09/07/22 1510  HGBA1C 5.8*    Objective:  VS:  HT:    WT:   BMI:     BP:   HR: bpm  TEMP: ( )  RESP:  Physical Exam Vitals and nursing note reviewed.  HENT:     Head: Normocephalic and atraumatic.     Right Ear: External ear normal.     Left Ear: External ear normal.     Nose: Nose normal.     Mouth/Throat:     Mouth: Mucous membranes are moist.  Eyes:     Extraocular Movements: Extraocular movements intact.  Cardiovascular:     Rate and Rhythm: Normal rate.     Pulses: Normal pulses.  Pulmonary:     Effort: Pulmonary effort is normal.  Abdominal:     General: Abdomen is flat. There is no distension.  Musculoskeletal:        General: Tenderness present.     Cervical back: Normal range of motion.     Comments: Pt is slow to rise from seated position to standing. Good lumbar range of motion. Strong distal strength without clonus, no pain upon palpation of greater trochanters. Sensation intact bilaterally. Dysesthesias noted to right L5 dermatome. Walks  independently, gait steady.   Skin:    General: Skin is warm and dry.     Capillary Refill: Capillary refill takes less than 2 seconds.  Neurological:     General: No focal deficit present.     Mental Status: She is alert and oriented to person, place, and time.  Psychiatric:        Mood and Affect: Mood normal.        Behavior: Behavior normal.     Ortho Exam  Imaging: No results found.  Past Medical/Family/Surgical/Social History: Medications & Allergies reviewed per EMR, new medications updated. Patient Active Problem List   Diagnosis Date Noted   Osteoarthritis of both knees 10/02/2022   Healthcare maintenance 09/07/2022   Pruritus 09/07/2022   Chronic pain of right knee 08/31/2022    Sciatica of right side 04/17/2022   GERD without esophagitis 12/23/2020   Anxiety 12/23/2020   Hypercholesteremia 12/23/2020   Lower extremity edema 07/17/2014   Polyuria 07/17/2014   Acanthosis nigricans 07/17/2014   ENDOGENOUS OBESITY 05/31/2009   CONTACT DERMATITIS&OTHER ECZEMA DUE UNSPEC CAUSE 05/31/2009   DEPRESSION 04/26/2009   Essential hypertension 04/26/2009   GASTRITIS 04/26/2009   CARDIAC MURMUR 04/26/2009   COUGH 04/26/2009   Past Medical History:  Diagnosis Date   Anxiety    Hypertension    Family History  Problem Relation Age of Onset   Colon cancer Neg Hx    Esophageal cancer Neg Hx    Rectal cancer Neg Hx    Stomach cancer Neg Hx    Past Surgical History:  Procedure Laterality Date   CESAREAN SECTION     Social History   Occupational History   Not on file  Tobacco Use   Smoking status: Never    Passive exposure: Never   Smokeless tobacco: Never  Vaping Use   Vaping Use: Never used  Substance and Sexual Activity   Alcohol use: Never   Drug use: Never   Sexual activity: Not on file

## 2023-01-24 NOTE — Progress Notes (Signed)
   01/24/23 [place] in the past year? 0  Number of falls in past year 0  Was there an injury with Fall? 0  Fall Risk Category Calculator 0  Fall Risk  Patient at Risk for Falls Due to No Fall Risks  Fall risk Follow up Falls evaluation completed;Education provided

## 2023-01-25 ENCOUNTER — Telehealth: Payer: Self-pay | Admitting: Physical Medicine and Rehabilitation

## 2023-01-25 NOTE — Telephone Encounter (Signed)
Patient's daughter in law  Verdis Frederickson called to schedule appointment. Her call back number is [phone]

## 2023-01-26 ENCOUNTER — Other Ambulatory Visit: Payer: Self-pay | Admitting: Family Medicine

## 2023-01-26 NOTE — Telephone Encounter (Signed)
Spoke with patient's daughter in law and scheduled injection for 02/06/23

## 2023-01-30 DIAGNOSIS — L600 Ingrowing nail: Secondary | ICD-10-CM | POA: Diagnosis not present

## 2023-02-06 ENCOUNTER — Ambulatory Visit (INDEPENDENT_AMBULATORY_CARE_PROVIDER_SITE_OTHER): Payer: Medicare Other | Admitting: Physical Medicine and Rehabilitation

## 2023-02-06 ENCOUNTER — Ambulatory Visit: Payer: Self-pay

## 2023-02-06 VITALS — BP 150/77 | HR 87

## 2023-02-06 DIAGNOSIS — M5416 Radiculopathy, lumbar region: Secondary | ICD-10-CM

## 2023-02-06 MED ORDER — METHYLPREDNISOLONE ACETATE 80 MG/ML IJ SUSP
80.0000 mg | Freq: Once | INTRAMUSCULAR | Status: AC
  Administered 2023-02-06: 80 mg

## 2023-02-06 NOTE — Patient Instructions (Signed)

## 2023-02-06 NOTE — Progress Notes (Signed)
Functional Pain Scale - descriptive words and definitions  Distracting (5)    Aware of pain/able to complete some ADL's but limited by pain/sleep is affected and active distractions are only slightly useful. Moderate range order  Average Pain 9   +Driver, -BT, -Dye Allergies.  Lower back pain all the way across the back that radiates down the right leg. Pain is worse when walking

## 2023-02-12 NOTE — Procedures (Signed)
Lumbar Epidural Steroid Injection - Interlaminar Approach with Fluoroscopic Guidance  Patient: Brenda Wyatt      Date of Birth: 1951-07-29 MRN: [id number] PCP: Donney Dice, DO      Visit Date: 02/06/2023   Universal Protocol:     Consent Given By: the patient  Position: PRONE  Additional Comments: Vital signs were monitored before and after the procedure. Patient was prepped and draped in the usual sterile fashion. The correct patient, procedure, and site was verified.   Injection Procedure Details:   Procedure diagnoses: Lumbar radiculopathy [M54.16]   Meds Administered:  Meds ordered this encounter  Medications   methylPREDNISolone acetate (DEPO-MEDROL) injection 80 mg     Laterality: Right  Location/Site:  L5-S1  Needle: 3.5 in., 20 ga. Tuohy  Needle Placement: Paramedian epidural  Findings:   -Comments: Excellent flow of contrast into the epidural space.  Procedure Details: Using a paramedian approach from the side mentioned above, the region overlying the inferior lamina was localized under fluoroscopic visualization and the soft tissues overlying this structure were infiltrated with 4 ml. of 1% Lidocaine without Epinephrine. The Tuohy needle was inserted into the epidural space using a paramedian approach.   The epidural space was localized using loss of resistance along with counter oblique bi-planar fluoroscopic views.  After negative aspirate for air, blood, and CSF, a 2 ml. volume of Isovue-250 was injected into the epidural space and the flow of contrast was observed. Radiographs were obtained for documentation purposes.    The injectate was administered into the level noted above.   Additional Comments:  No complications occurred Dressing: 2 x 2 sterile gauze and Band-Aid    Post-procedure details: Patient was observed during the procedure. Post-procedure instructions were reviewed.  Patient left the clinic in stable condition.

## 2023-02-12 NOTE — Progress Notes (Signed)
Brenda Wyatt - 71 y.o. female MRN [id number]  Date of birth: May 11, 1951  Office Visit Note: Visit Date: 02/06/2023 PCP: Donney Dice, DO Referred by: Donney Dice, DO  Subjective: Chief Complaint  Patient presents with   Lower Back - Pain   HPI:  Brenda Wyatt is a 72 y.o. female who comes in today at the request of Barnet Pall, FNP for planned Right L5-S1 Lumbar Interlaminar epidural steroid injection with fluoroscopic guidance.  The patient has failed conservative care including home exercise, medications, time and activity modification.  This injection will be diagnostic and hopefully therapeutic.  Please see requesting physician notes for further details and justification.   ROS Otherwise per HPI.  Assessment & Plan: Visit Diagnoses:    ICD-10-CM   1. Lumbar radiculopathy  M54.16 XR C-ARM NO REPORT    Epidural Steroid injection    methylPREDNISolone acetate (DEPO-MEDROL) injection 80 mg      Plan: No additional findings.   Meds & Orders:  Meds ordered this encounter  Medications   methylPREDNISolone acetate (DEPO-MEDROL) injection 80 mg    Orders Placed This Encounter  Procedures   XR C-ARM NO REPORT   Epidural Steroid injection    Follow-up: Return for visit to requesting provider as needed.   Procedures: No procedures performed  Lumbar Epidural Steroid Injection - Interlaminar Approach with Fluoroscopic Guidance  Patient: Brenda Wyatt      Date of Birth: [birth year] MRN: [id number] PCP: Donney Dice, DO      Visit Date: 02/06/2023   Universal Protocol:     Consent Given By: the patient  Position: PRONE  Additional Comments: Vital signs were monitored before and after the procedure. Patient was prepped and draped in the usual sterile fashion. The correct patient, procedure, and site was verified.   Injection Procedure Details:   Procedure diagnoses: Lumbar radiculopathy [M54.16]   Meds Administered:  Meds  ordered this encounter  Medications   methylPREDNISolone acetate (DEPO-MEDROL) injection 80 mg     Laterality: Right  Location/Site:  L5-S1  Needle: 3.5 in., 20 ga. Tuohy  Needle Placement: Paramedian epidural  Findings:   -Comments: Excellent flow of contrast into the epidural space.  Procedure Details: Using a paramedian approach from the side mentioned above, the region overlying the inferior lamina was localized under fluoroscopic visualization and the soft tissues overlying this structure were infiltrated with 4 ml. of 1% Lidocaine without Epinephrine. The Tuohy needle was inserted into the epidural space using a paramedian approach.   The epidural space was localized using loss of resistance along with counter oblique bi-planar fluoroscopic views.  After negative aspirate for air, blood, and CSF, a 2 ml. volume of Isovue-250 was injected into the epidural space and the flow of contrast was observed. Radiographs were obtained for documentation purposes.    The injectate was administered into the level noted above.   Additional Comments:  No complications occurred Dressing: 2 x 2 sterile gauze and Band-Aid    Post-procedure details: Patient was observed during the procedure. Post-procedure instructions were reviewed.  Patient left the clinic in stable condition.   Clinical History: MRI LUMBAR SPINE WITHOUT CONTRAST   TECHNIQUE: Multiplanar, multisequence MR imaging of the lumbar spine was performed. No intravenous contrast was administered.   COMPARISON:  Lumbar radiographs 10/30/2022.   FINDINGS: Segmentation:  Normal on the comparison.   Alignment: Stable and relatively normal lumbar lordosis. There is subtle anterolisthesis at L3-L4 and L4-L5.   Vertebrae: Some chronic degenerative endplate marrow signal changes. No  marrow edema or evidence of acute osseous abnormality. Normal background bone marrow signal. Intact visible sacrum.   Conus medullaris and  cauda equina: Conus extends to the L1 level. No lower spinal cord or conus signal abnormality.   Paraspinal and other soft tissues: Negative.   Disc levels:   T11-T12: Negative.   T12-L1: Tiny central disc protrusion (series 9, image 5). Mild facet hypertrophy. No stenosis.   L1-L2: Similar mild disc bulging and lobulated central disc protrusion. No stenosis.   L2-L3: Mild disc bulging and similar small central disc bulge or protrusion. Mild facet hypertrophy greater on the left. No spinal stenosis. Mild left lateral recess stenosis (left L3 nerve level). Mild left L2 foraminal stenosis.   L3-L4: Circumferential disc bulge, bulky far lateral components greater on the left. Mild facet hypertrophy. No spinal stenosis. No convincing lateral recess stenosis. Mild bilateral L3 foraminal stenosis.   L4-L5: Circumferential disc bulge with some endplate spurring. Mild facet and ligament flavum hypertrophy. No spinal or convincing lateral recess stenosis. Moderate to severe left and mild right L4 neural foraminal stenosis.   L5-S1: Mild far lateral disc bulging and endplate spurring. Mild facet hypertrophy. No stenosis.   IMPRESSION: 1. No acute osseous abnormality and generally mild for age lumbar spine degeneration. No lumbar spinal stenosis. 2. 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 Otherwise mild neural foraminal stenosis (right L3 and L4 nerve levels).     Electronically Signed   By: Genevie Ann M.D.   On: 12/01/2022 10:27     Objective:  VS:  HT:    WT:   BMI:     BP:(!) 150/77  HR:87bpm  TEMP: ( )  RESP:  Physical Exam Vitals and nursing note reviewed.  Constitutional:      General: She is not in acute distress.    Appearance: Normal appearance. She is not ill-appearing.  HENT:     Head: Normocephalic and atraumatic.     Right Ear: External ear normal.     Left Ear: External ear normal.  Eyes:     Extraocular  Movements: Extraocular movements intact.  Cardiovascular:     Rate and Rhythm: Normal rate.     Pulses: Normal pulses.  Pulmonary:     Effort: Pulmonary effort is normal. No respiratory distress.  Abdominal:     General: There is no distension.     Palpations: Abdomen is soft.  Musculoskeletal:        General: Tenderness present.     Cervical back: Neck supple.     Right lower leg: No edema.     Left lower leg: No edema.     Comments: Patient has good distal strength with no pain over the greater trochanters.  No clonus or focal weakness.  Skin:    Findings: No erythema, lesion or rash.  Neurological:     General: No focal deficit present.     Mental Status: She is alert and oriented to person, place, and time.     Sensory: No sensory deficit.     Motor: No weakness or abnormal muscle tone.     Coordination: Coordination normal.  Psychiatric:        Mood and Affect: Mood normal.        Behavior: Behavior normal.      Imaging: No results found.

## 2023-02-13 DIAGNOSIS — B351 Tinea unguium: Secondary | ICD-10-CM | POA: Diagnosis not present

## 2023-02-13 DIAGNOSIS — L600 Ingrowing nail: Secondary | ICD-10-CM | POA: Diagnosis not present

## 2023-02-13 DIAGNOSIS — M79675 Pain in left toe(s): Secondary | ICD-10-CM | POA: Diagnosis not present

## 2023-02-13 DIAGNOSIS — M79674 Pain in right toe(s): Secondary | ICD-10-CM | POA: Diagnosis not present

## 2023-04-18 ENCOUNTER — Encounter (HOSPITAL_BASED_OUTPATIENT_CLINIC_OR_DEPARTMENT_OTHER): Payer: Self-pay | Admitting: Emergency Medicine

## 2023-04-18 ENCOUNTER — Emergency Department (HOSPITAL_BASED_OUTPATIENT_CLINIC_OR_DEPARTMENT_OTHER)
Admission: EM | Admit: 2023-04-18 | Discharge: 2023-04-18 | Disposition: A | Discharge status: Home / Self Care | Payer: Medicare Other | Attending: Emergency Medicine | Admitting: Emergency Medicine

## 2023-04-18 ENCOUNTER — Other Ambulatory Visit: Payer: Self-pay

## 2023-04-18 DIAGNOSIS — R21 Rash and other nonspecific skin eruption: Secondary | ICD-10-CM | POA: Insufficient documentation

## 2023-04-18 DIAGNOSIS — L299 Pruritus, unspecified: Secondary | ICD-10-CM

## 2023-04-18 DIAGNOSIS — N390 Urinary tract infection, site not specified: Secondary | ICD-10-CM

## 2023-04-18 DIAGNOSIS — N3090 Cystitis, unspecified without hematuria: Secondary | ICD-10-CM | POA: Insufficient documentation

## 2023-04-18 DIAGNOSIS — R300 Dysuria: Secondary | ICD-10-CM | POA: Diagnosis present

## 2023-04-18 LAB — URINALYSIS, ROUTINE W REFLEX MICROSCOPIC
Bilirubin Urine: NEGATIVE
Glucose, UA: NEGATIVE mg/dL
Ketones, ur: NEGATIVE mg/dL
Nitrite: NEGATIVE
Protein, ur: NEGATIVE mg/dL
Specific Gravity, Urine: 1.022 (ref 1.005–1.030)
pH: 5.5 (ref 5.0–8.0)

## 2023-04-18 MED ORDER — TRIAMCINOLONE ACETONIDE 0.5 % EX OINT: 1.0000 | TOPICAL_OINTMENT | Freq: Two times a day (BID) | CUTANEOUS | 0 refills | Status: AC

## 2023-04-18 MED ORDER — CEPHALEXIN 500 MG PO CAPS: 500.0000 mg | ORAL_CAPSULE | Freq: Two times a day (BID) | ORAL | 0 refills | Status: DC

## 2023-04-18 NOTE — ED Triage Notes (Signed)
Pt arrived POV with family, caox4 and ambulatory in triage. Pt c/o vaginal rash that has spread to the lower abd with burning pain on urination x2 days.

## 2023-04-18 NOTE — ED Provider Notes (Signed)
[place] EMERGENCY DEPARTMENT AT [facility] Provider Note   CSN: [id number] Arrival date & time: 04/18/23  1727     History  Chief Complaint  Patient presents with   Dysuria   Rash    Brenda Wyatt is a 72 y.o. female.  Patient is a 72 year old female who presents with burning on urination.  She reports a 2-day history of burning when she urinates.  She denies any associate abdominal pain.  No nausea or vomiting.  No known fevers.  She also has developed a rash that started also within the last few days.  It is a itchy rash that is across her upper abdomen and a little bit on her arms.  She has been using some Benadryl cream without much effect.  She denies a known history of similar rashes.  No new exposures.  No oral swelling or shortness of breath.  History was obtained through a video language line.       Home Medications Prior to Admission medications   Medication Sig Start Date End Date Taking? Authorizing Provider  cephALEXin (KEFLEX) 500 MG capsule Take 1 capsule (500 mg total) by mouth 2 (two) times daily. 04/18/23  Yes Rolan Bucco, MD  amLODipine-valsartan (EXFORGE) 5-160 MG tablet Take 1 tablet by mouth daily.    [provider]  atorvastatin (LIPITOR) 10 MG tablet Take 1 tablet (10 mg total) by mouth daily. 08/08/22   Ganta, Anupa, DO  diclofenac Sodium (VOLTAREN) 1 % GEL Apply 4 g topically 4 (four) times daily. 09/12/22   Celine Mans, MD  fexofenadine (ALLEGRA) 180 MG tablet Take 1 tablet (180 mg total) by mouth daily. 09/12/22   Celine Mans, MD  meloxicam (MOBIC) 15 MG tablet TAKE 1 TABLET(15 MG) BY MOUTH DAILY 01/26/23   Ganta, Anupa, DO  omeprazole (PRILOSEC) 20 MG capsule Take 1 capsule (20 mg total) by mouth daily. 12/22/20   Ganta, Anupa, DO  triamcinolone ointment (KENALOG) 0.5 % Apply 1 Application topically 2 (two) times daily. 04/18/23   Rolan Bucco, MD      Allergies    Ace inhibitors and Penicillins    Review  of Systems   Review of Systems  Constitutional:  Negative for chills, diaphoresis, fatigue and fever.  HENT:  Negative for congestion, rhinorrhea and sneezing.   Eyes: Negative.   Respiratory:  Negative for cough, chest tightness and shortness of breath.   Cardiovascular:  Negative for chest pain and leg swelling.  Gastrointestinal:  Negative for abdominal pain, blood in stool, diarrhea, nausea and vomiting.  Genitourinary:  Positive for dysuria (Burning on urination). Negative for difficulty urinating, flank pain, frequency and hematuria.  Musculoskeletal:  Negative for arthralgias and back pain.  Skin:  Positive for rash.  Neurological:  Negative for dizziness, speech difficulty, weakness, numbness and headaches.    Physical Exam Updated Vital Signs BP (!) 162/61 (BP Location: Right Arm)   Pulse 85   Temp 98 F (36.7 C) (Oral)   Resp 20   SpO2 100%  Physical Exam Constitutional:      Appearance: She is well-developed.  HENT:     Head: Normocephalic and atraumatic.  Eyes:     Pupils: Pupils are equal, round, and reactive to light.  Cardiovascular:     Rate and Rhythm: Normal rate and regular rhythm.     Heart sounds: Normal heart sounds.  Pulmonary:     Effort: Pulmonary effort is normal. No respiratory distress.     Breath sounds: Normal breath sounds.  No wheezing or rales.  Chest:     Chest wall: No tenderness.  Abdominal:     General: Bowel sounds are normal.     Palpations: Abdomen is soft.     Tenderness: There is no abdominal tenderness. There is no guarding or rebound.  Musculoskeletal:        General: Normal range of motion.     Cervical back: Normal range of motion and neck supple.  Lymphadenopathy:     Cervical: No cervical adenopathy.  Skin:    General: Skin is warm and dry.     Findings: Rash present.     Comments: Scaly/slightly erythematous rash across her upper abdomen and 1 patch on her left forearm.  No petechiae.  No purpura.  It is slightly  blanching.  No vesicles.  Neurological:     Mental Status: She is alert and oriented to person, place, and time.     ED Results / Procedures / Treatments   Labs (all labs ordered are listed, but only abnormal results are displayed) Labs Reviewed  URINALYSIS, ROUTINE W REFLEX MICROSCOPIC - Abnormal; Notable for the following components:      Result Value   Hgb urine dipstick SMALL (*)    Leukocytes,Ua LARGE (*)    Bacteria, UA RARE (*)    All other components within normal limits  URINE CULTURE    EKG None  Radiology No results found.  Procedures Procedures    Medications Ordered in ED Medications - No data to display  ED Course/ Medical Decision Making/ A&P                             Medical Decision Making Amount and/or Complexity of Data Reviewed Labs: ordered.  Risk Prescription drug management.   Patient is a 72 year old female who presents with burning in urination.  Her urine is consistent with infection.  It was sent for culture.  On visual exam, there is no apparent rash on her external labia/vaginal area.  She was started on Keflex.  She does not appear systemically ill.  No fever.  No associate abdominal pain.  No vomiting.  She does have a rash across her upper abdomen.  It looks like an allergic/eczema type rash.  On chart review, I do see that she had a similar appearing rash from a visit to her primary care doctor about a year ago.  She was prescribed triamcinolone cream.  Will reorder that and advised her to follow-up with her primary care doctor.  Return precautions were given.  Final Clinical Impression(s) / ED Diagnoses Final diagnoses:  Urinary tract infection without hematuria, site unspecified  Rash    Rx / DC Orders ED Discharge Orders          Ordered    triamcinolone ointment (KENALOG) 0.5 %  2 times daily        04/18/23 1841    cephALEXin (KEFLEX) 500 MG capsule  2 times daily        04/18/23 1841              Rolan Bucco, MD 04/18/23 1844

## 2023-04-18 NOTE — ED Notes (Signed)
Interpreter services used. Pt verbalized understanding of d/c instructions, meds, and followup care. Denies questions. VSS, no distress noted. Steady gait to exit with all belongings.

## 2023-04-20 LAB — URINE CULTURE

## 2023-10-12 ENCOUNTER — Other Ambulatory Visit: Payer: Self-pay | Admitting: Student

## 2024-07-07 ENCOUNTER — Other Ambulatory Visit: Payer: Self-pay

## 2024-07-07 MED ORDER — AMLODIPINE BESYLATE-VALSARTAN 5-160 MG PO TABS: 1.0000 | ORAL_TABLET | Freq: Every day | ORAL | 0 refills | Status: DC

## 2024-07-12 ENCOUNTER — Other Ambulatory Visit: Payer: Self-pay | Admitting: Family Medicine

## 2024-07-23 ENCOUNTER — Ambulatory Visit (INDEPENDENT_AMBULATORY_CARE_PROVIDER_SITE_OTHER): Admitting: Family Medicine

## 2024-07-23 ENCOUNTER — Encounter: Payer: Self-pay | Admitting: Family Medicine

## 2024-07-23 VITALS — BP 157/74 | HR 82 | Ht 59.0 in | Wt 192.6 lb

## 2024-07-23 DIAGNOSIS — Z1231 Encounter for screening mammogram for malignant neoplasm of breast: Secondary | ICD-10-CM | POA: Diagnosis not present

## 2024-07-23 DIAGNOSIS — Z780 Asymptomatic menopausal state: Secondary | ICD-10-CM

## 2024-07-23 DIAGNOSIS — R3129 Other microscopic hematuria: Secondary | ICD-10-CM | POA: Diagnosis not present

## 2024-07-23 DIAGNOSIS — L259 Unspecified contact dermatitis, unspecified cause: Secondary | ICD-10-CM | POA: Diagnosis not present

## 2024-07-23 DIAGNOSIS — I10 Essential (primary) hypertension: Secondary | ICD-10-CM

## 2024-07-23 LAB — POCT URINALYSIS DIP (MANUAL ENTRY)
Bilirubin, UA: NEGATIVE
Glucose, UA: NEGATIVE mg/dL
Ketones, POC UA: NEGATIVE mg/dL
Leukocytes, UA: NEGATIVE
Nitrite, UA: NEGATIVE
Protein Ur, POC: NEGATIVE mg/dL
Spec Grav, UA: 1.02 (ref 1.010–1.025)
Urobilinogen, UA: 1 U/dL
pH, UA: 6 (ref 5.0–8.0)

## 2024-07-23 LAB — POCT UA - MICROSCOPIC ONLY: WBC, Ur, HPF, POC: NONE SEEN (ref 0–5)

## 2024-07-23 NOTE — Assessment & Plan Note (Addendum)
 Not adequately controlled - pt prefers to not take full tablet or change meds at this time Recommend BP log and f/u Discussed return precautions Check BMP, lipid panel today

## 2024-07-23 NOTE — Patient Instructions (Signed)
Blood Pressure Record Sheet To take your blood pressure, you will need a blood pressure machine. You can buy a blood pressure machine (blood pressure monitor) at your clinic, drug store, or online. When choosing one, consider: An automatic monitor that has an arm cuff. A cuff that wraps snugly around your upper arm. You should be able to fit only one finger between your arm and the cuff. A device that stores blood pressure reading results. Do not choose a monitor that measures your blood pressure from your wrist or finger. Follow your health care provider's instructions for how to take your blood pressure. To use this form: Take your blood pressure medications every day These measurements should be taken when you have been at rest for at least 10-15 min Take at least 2 readings with each blood pressure check. This makes sure the results are correct. Wait 1-2 minutes between measurements. Write down the results in the spaces on this form. Keep in mind it should always be recorded systolic over diastolic. Both numbers are important.  Repeat this every day for 2-3 weeks, or as told by your health care provider.  Make a follow-up appointment with your health care provider to discuss the results.  Blood Pressure Log Date Medications taken? (Y/N) Blood Pressure Time of Day

## 2024-07-23 NOTE — Progress Notes (Signed)
    SUBJECTIVE:   CHIEF COMPLAINT / HPI:   Medication management  HTN On amlodipine -valsartan  5-160 daily - reports only taking half tablet instead of a full tablet because a full tablet made her feel weird/light headed. Prefers not to change this or add anything today Denies syncope, chest pain, SOB, headache   For allergic reaction/rash on her arms in [place] was prescribed Sulodexide? Takes this twice daily for the past 6 months  Denies dysuria, hematuria, fevers, flank pain  PERTINENT  PMH / PSH: HTN  OBJECTIVE:   BP (!) 157/74   Pulse 82   Ht 4' 11 (1.499 m)   Wt 192 lb 9.6 oz (87.4 kg)   SpO2 99%   BMI 38.90 kg/m   General: NAD, pleasant, able to participate in exam Cardiac: RRR, no murmurs auscultated Respiratory: CTAB, normal WOB Abdomen: soft, non-tender, non-distended, normoactive bowel sounds Extremities: warm and well perfused, no edema or cyanosis Skin: warm and dry, no rashes noted Neuro: alert, no obvious focal deficits, speech normal Psych: Normal affect and mood  ASSESSMENT/PLAN:   Assessment & Plan Essential hypertension Not adequately controlled - pt prefers to not take full tablet or change meds at this time Recommend BP log and f/u Discussed return precautions Check BMP, lipid panel today Encounter for screening mammogram for malignant neoplasm of breast Mammogram ordered Asymptomatic postmenopausal estrogen deficiency DEXA ordered Microscopic hematuria Noted on previous UA, recheck today, urology referral if noted again Update: microscopy without RBCs Contact dermatitis, unspecified contact dermatitis type, unspecified trigger Single episode, ?triggered by cosmetics Advised to stop taking the sulodexide If has recurrent itching/rash can try OTC zyrtec or seek medical attention - discussed return precautions   Payton Coward, MD [facility]

## 2024-07-24 LAB — BASIC METABOLIC PANEL WITH GFR
BUN/Creatinine Ratio: 26 (ref 12–28)
BUN: 18 mg/dL (ref 8–27)
CO2: 23 mmol/L (ref 20–29)
Calcium: 9.1 mg/dL (ref 8.7–10.3)
Chloride: 103 mmol/L (ref 96–106)
Creatinine, Ser: 0.69 mg/dL (ref 0.57–1.00)
Glucose: 103 mg/dL — ABNORMAL HIGH (ref 70–99)
Potassium: 5 mmol/L (ref 3.5–5.2)
Sodium: 140 mmol/L (ref 134–144)
eGFR: 93 mL/min/1.73 (ref >59)

## 2024-07-24 LAB — LIPID PANEL
Chol/HDL Ratio: 3.5 ratio (ref 0.0–4.4)
Cholesterol, Total: 212 mg/dL — ABNORMAL HIGH (ref 100–199)
HDL: 60 mg/dL (ref >39)
LDL Chol Calc (NIH): 140 mg/dL — ABNORMAL HIGH (ref 0–99)
Triglycerides: 68 mg/dL (ref 0–149)
VLDL Cholesterol Cal: 12 mg/dL (ref 5–40)

## 2024-07-30 ENCOUNTER — Ambulatory Visit: Payer: Self-pay | Admitting: Family Medicine

## 2024-07-30 DIAGNOSIS — E7800 Pure hypercholesterolemia, unspecified: Secondary | ICD-10-CM

## 2024-07-31 MED ORDER — ATORVASTATIN CALCIUM 10 MG PO TABS: 10.0000 mg | ORAL_TABLET | Freq: Every day | ORAL | 3 refills | Status: AC
